# Patient Record
Sex: Female | Born: 1945 | Race: Black or African American | Hispanic: No | Marital: Married | Smoking: Former smoker
Health system: Southern US, Community
[De-identification: ages and names within clinical notes are randomized; demographics above are authoritative.]

## PROBLEM LIST (undated history)

## (undated) DIAGNOSIS — Z973 Presence of spectacles and contact lenses: Secondary | ICD-10-CM

## (undated) DIAGNOSIS — K219 Gastro-esophageal reflux disease without esophagitis: Secondary | ICD-10-CM

## (undated) DIAGNOSIS — I10 Essential (primary) hypertension: Secondary | ICD-10-CM

---

## 1995-02-27 HISTORY — PX: ABDOMINAL HYSTERECTOMY: SHX81

## 1998-12-08 ENCOUNTER — Encounter: Admission: RE | Admit: 1998-12-08 | Discharge: 1998-12-08 | Payer: Self-pay | Admitting: Family Medicine

## 1999-07-26 ENCOUNTER — Encounter: Admission: RE | Admit: 1999-07-26 | Discharge: 1999-07-26 | Payer: Self-pay | Admitting: Obstetrics and Gynecology

## 1999-07-26 ENCOUNTER — Encounter: Payer: Self-pay | Admitting: Obstetrics and Gynecology

## 2002-03-24 ENCOUNTER — Encounter: Admission: RE | Admit: 2002-03-24 | Discharge: 2002-03-24 | Payer: Self-pay | Admitting: Family Medicine

## 2002-03-24 ENCOUNTER — Encounter: Payer: Self-pay | Admitting: Family Medicine

## 2002-03-27 ENCOUNTER — Encounter: Admission: RE | Admit: 2002-03-27 | Discharge: 2002-03-27 | Payer: Self-pay | Admitting: Obstetrics and Gynecology

## 2002-03-27 ENCOUNTER — Encounter: Payer: Self-pay | Admitting: Family Medicine

## 2002-09-28 ENCOUNTER — Encounter: Admission: RE | Admit: 2002-09-28 | Discharge: 2002-09-28 | Payer: Self-pay | Admitting: Obstetrics and Gynecology

## 2002-09-28 ENCOUNTER — Encounter: Payer: Self-pay | Admitting: Obstetrics and Gynecology

## 2004-11-22 ENCOUNTER — Other Ambulatory Visit: Admission: RE | Admit: 2004-11-22 | Discharge: 2004-11-22 | Payer: Self-pay | Admitting: Family Medicine

## 2007-08-15 ENCOUNTER — Encounter: Admission: RE | Admit: 2007-08-15 | Discharge: 2007-08-15 | Payer: Self-pay | Admitting: Family Medicine

## 2008-09-27 ENCOUNTER — Encounter: Admission: RE | Admit: 2008-09-27 | Discharge: 2008-09-27 | Payer: Self-pay | Admitting: Family Medicine

## 2010-09-29 ENCOUNTER — Encounter (INDEPENDENT_AMBULATORY_CARE_PROVIDER_SITE_OTHER): Payer: BC Managed Care – PPO | Admitting: Ophthalmology

## 2010-09-29 DIAGNOSIS — H2510 Age-related nuclear cataract, unspecified eye: Secondary | ICD-10-CM

## 2010-09-29 DIAGNOSIS — H33309 Unspecified retinal break, unspecified eye: Secondary | ICD-10-CM

## 2010-09-29 DIAGNOSIS — H35039 Hypertensive retinopathy, unspecified eye: Secondary | ICD-10-CM

## 2010-09-29 DIAGNOSIS — H43819 Vitreous degeneration, unspecified eye: Secondary | ICD-10-CM

## 2010-10-11 ENCOUNTER — Encounter (INDEPENDENT_AMBULATORY_CARE_PROVIDER_SITE_OTHER): Payer: BC Managed Care – PPO | Admitting: Ophthalmology

## 2010-10-11 DIAGNOSIS — H33309 Unspecified retinal break, unspecified eye: Secondary | ICD-10-CM

## 2011-02-09 ENCOUNTER — Ambulatory Visit (INDEPENDENT_AMBULATORY_CARE_PROVIDER_SITE_OTHER): Payer: BC Managed Care – PPO | Admitting: Ophthalmology

## 2011-02-09 DIAGNOSIS — Q143 Congenital malformation of choroid: Secondary | ICD-10-CM

## 2011-02-09 DIAGNOSIS — H33309 Unspecified retinal break, unspecified eye: Secondary | ICD-10-CM

## 2011-02-09 DIAGNOSIS — E11319 Type 2 diabetes mellitus with unspecified diabetic retinopathy without macular edema: Secondary | ICD-10-CM

## 2011-02-09 DIAGNOSIS — E1139 Type 2 diabetes mellitus with other diabetic ophthalmic complication: Secondary | ICD-10-CM

## 2011-02-09 DIAGNOSIS — H43819 Vitreous degeneration, unspecified eye: Secondary | ICD-10-CM

## 2011-03-08 ENCOUNTER — Other Ambulatory Visit: Payer: Self-pay | Admitting: Orthopedic Surgery

## 2011-03-09 ENCOUNTER — Encounter (HOSPITAL_BASED_OUTPATIENT_CLINIC_OR_DEPARTMENT_OTHER): Payer: Self-pay | Admitting: *Deleted

## 2011-03-09 NOTE — Progress Notes (Signed)
To come in for bmet-ekg  

## 2011-03-12 ENCOUNTER — Other Ambulatory Visit: Payer: Self-pay

## 2011-03-12 ENCOUNTER — Encounter (HOSPITAL_BASED_OUTPATIENT_CLINIC_OR_DEPARTMENT_OTHER)
Admission: RE | Admit: 2011-03-12 | Discharge: 2011-03-12 | Disposition: A | Discharge status: Home / Self Care | Payer: BC Managed Care – PPO | Source: Ambulatory Visit | Attending: Orthopedic Surgery | Admitting: Orthopedic Surgery

## 2011-03-12 LAB — BASIC METABOLIC PANEL
BUN: 15 mg/dL (ref 6–23)
CO2: 30 mEq/L (ref 19–32)
Chloride: 98 mEq/L (ref 96–112)
Creatinine, Ser: 0.72 mg/dL (ref 0.50–1.10)
GFR calc Af Amer: >90 mL/min (ref >90)

## 2011-03-13 ENCOUNTER — Encounter (HOSPITAL_BASED_OUTPATIENT_CLINIC_OR_DEPARTMENT_OTHER)
Admission: RE | Disposition: A | Discharge status: Home / Self Care | Payer: Self-pay | Source: Ambulatory Visit | Attending: Orthopedic Surgery

## 2011-03-13 ENCOUNTER — Encounter (HOSPITAL_BASED_OUTPATIENT_CLINIC_OR_DEPARTMENT_OTHER): Payer: Self-pay | Admitting: Anesthesiology

## 2011-03-13 ENCOUNTER — Encounter (HOSPITAL_BASED_OUTPATIENT_CLINIC_OR_DEPARTMENT_OTHER): Payer: Self-pay | Admitting: Orthopedic Surgery

## 2011-03-13 ENCOUNTER — Ambulatory Visit (HOSPITAL_BASED_OUTPATIENT_CLINIC_OR_DEPARTMENT_OTHER): Payer: BC Managed Care – PPO | Admitting: Anesthesiology

## 2011-03-13 ENCOUNTER — Ambulatory Visit (HOSPITAL_BASED_OUTPATIENT_CLINIC_OR_DEPARTMENT_OTHER)
Admission: RE | Admit: 2011-03-13 | Discharge: 2011-03-13 | Disposition: A | Discharge status: Home / Self Care | Payer: BC Managed Care – PPO | Source: Ambulatory Visit | Attending: Orthopedic Surgery | Admitting: Orthopedic Surgery

## 2011-03-13 DIAGNOSIS — E119 Type 2 diabetes mellitus without complications: Secondary | ICD-10-CM | POA: Insufficient documentation

## 2011-03-13 DIAGNOSIS — K219 Gastro-esophageal reflux disease without esophagitis: Secondary | ICD-10-CM | POA: Insufficient documentation

## 2011-03-13 DIAGNOSIS — Z01810 Encounter for preprocedural cardiovascular examination: Secondary | ICD-10-CM | POA: Insufficient documentation

## 2011-03-13 DIAGNOSIS — M654 Radial styloid tenosynovitis [de Quervain]: Secondary | ICD-10-CM | POA: Insufficient documentation

## 2011-03-13 DIAGNOSIS — I10 Essential (primary) hypertension: Secondary | ICD-10-CM | POA: Insufficient documentation

## 2011-03-13 HISTORY — DX: Essential (primary) hypertension: I10

## 2011-03-13 HISTORY — DX: Gastro-esophageal reflux disease without esophagitis: K21.9

## 2011-03-13 HISTORY — PX: DORSAL COMPARTMENT RELEASE: SHX5039

## 2011-03-13 LAB — GLUCOSE, CAPILLARY: Glucose-Capillary: 148 mg/dL — ABNORMAL HIGH (ref 70–99)

## 2011-03-13 LAB — POCT HEMOGLOBIN-HEMACUE: Hemoglobin: 13.8 g/dL (ref 12.0–15.0)

## 2011-03-13 SURGERY — RELEASE, FIRST DORSAL COMPARTMENT, HAND
Anesthesia: Monitor Anesthesia Care | Site: Wrist | Laterality: Right | Wound class: Clean

## 2011-03-13 MED ORDER — METOCLOPRAMIDE HCL 5 MG/ML IJ SOLN: 10.0000 mg | Freq: Once | INTRAMUSCULAR | Status: DC | PRN

## 2011-03-13 MED ORDER — HYDROCODONE-ACETAMINOPHEN 5-325 MG PO TABS: ORAL_TABLET | ORAL | Status: AC

## 2011-03-13 MED ORDER — CHLORHEXIDINE GLUCONATE 4 % EX LIQD: 60.0000 mL | Freq: Once | CUTANEOUS | Status: DC

## 2011-03-13 MED ORDER — MORPHINE SULFATE 2 MG/ML IJ SOLN: 0.0500 mg/kg | INTRAMUSCULAR | Status: DC | PRN

## 2011-03-13 MED ORDER — LIDOCAINE HCL (CARDIAC) 20 MG/ML IV SOLN
INTRAVENOUS | Status: DC | PRN
  Administered 2011-03-13: 40 mg via INTRAVENOUS

## 2011-03-13 MED ORDER — ONDANSETRON HCL 4 MG/2ML IJ SOLN
INTRAMUSCULAR | Status: DC | PRN
  Administered 2011-03-13: 4 mg via INTRAVENOUS

## 2011-03-13 MED ORDER — LACTATED RINGERS IV SOLN
INTRAVENOUS | Status: DC
  Administered 2011-03-13 (×2): via INTRAVENOUS

## 2011-03-13 MED ORDER — FENTANYL CITRATE 0.05 MG/ML IJ SOLN
INTRAMUSCULAR | Status: DC | PRN
  Administered 2011-03-13: 50 ug via INTRAVENOUS

## 2011-03-13 MED ORDER — LIDOCAINE HCL 2 % IJ SOLN
INTRAMUSCULAR | Status: DC | PRN
  Administered 2011-03-13: 3 mL

## 2011-03-13 MED ORDER — MIDAZOLAM HCL 5 MG/5ML IJ SOLN
INTRAMUSCULAR | Status: DC | PRN
  Administered 2011-03-13: 1 mg via INTRAVENOUS

## 2011-03-13 MED ORDER — FENTANYL CITRATE 0.05 MG/ML IJ SOLN
25.0000 ug | INTRAMUSCULAR | Status: DC | PRN
  Administered 2011-03-13 (×2): 25 ug via INTRAVENOUS

## 2011-03-13 MED ORDER — PROPOFOL 10 MG/ML IV EMUL
INTRAVENOUS | Status: DC | PRN
  Administered 2011-03-13: 200 ug/kg/min via INTRAVENOUS

## 2011-03-13 SURGICAL SUPPLY — 41 items
BANDAGE ELASTIC 3 VELCRO ST LF (GAUZE/BANDAGES/DRESSINGS) ×2 IMPLANT
BLADE MINI RND TIP GREEN BEAV (BLADE) IMPLANT
BLADE SURG 15 STRL LF DISP TIS (BLADE) ×1 IMPLANT
BLADE SURG 15 STRL SS (BLADE) ×2
BNDG CMPR 9X4 STRL LF SNTH (GAUZE/BANDAGES/DRESSINGS) ×1
BNDG ESMARK 4X9 LF (GAUZE/BANDAGES/DRESSINGS) ×1 IMPLANT
BRUSH SCRUB EZ PLAIN DRY (MISCELLANEOUS) ×2 IMPLANT
CLOTH BEACON ORANGE TIMEOUT ST (SAFETY) ×2 IMPLANT
CORDS BIPOLAR (ELECTRODE) IMPLANT
COVER MAYO STAND STRL (DRAPES) ×2 IMPLANT
COVER TABLE BACK 60X90 (DRAPES) ×2 IMPLANT
CUFF TOURNIQUET SINGLE 18IN (TOURNIQUET CUFF) IMPLANT
DECANTER SPIKE VIAL GLASS SM (MISCELLANEOUS) IMPLANT
DRAPE EXTREMITY T 121X128X90 (DRAPE) ×2 IMPLANT
DRAPE SURG 17X23 STRL (DRAPES) ×2 IMPLANT
DRSG TEGADERM 4X4.75 (GAUZE/BANDAGES/DRESSINGS) ×1 IMPLANT
GLOVE BIO SURGEON STRL SZ 6.5 (GLOVE) ×1 IMPLANT
GLOVE BIOGEL M STRL SZ7.5 (GLOVE) ×2 IMPLANT
GLOVE EXAM NITRILE MD LF STRL (GLOVE) ×1 IMPLANT
GLOVE ORTHO TXT STRL SZ7.5 (GLOVE) ×2 IMPLANT
GOWN PREVENTION PLUS XLARGE (GOWN DISPOSABLE) ×2 IMPLANT
GOWN PREVENTION PLUS XXLARGE (GOWN DISPOSABLE) ×2 IMPLANT
GOWN STRL REIN XL XLG (GOWN DISPOSABLE) ×2 IMPLANT
NEEDLE 27GAX1X1/2 (NEEDLE) ×1 IMPLANT
PACK BASIN DAY SURGERY FS (CUSTOM PROCEDURE TRAY) ×2 IMPLANT
PAD CAST 3X4 CTTN HI CHSV (CAST SUPPLIES) IMPLANT
PADDING CAST ABS 4INX4YD NS (CAST SUPPLIES)
PADDING CAST ABS COTTON 4X4 ST (CAST SUPPLIES) ×1 IMPLANT
PADDING CAST COTTON 3X4 STRL (CAST SUPPLIES)
SLEEVE SCD COMPRESS KNEE MED (MISCELLANEOUS) IMPLANT
SPONGE GAUZE 4X4 12PLY (GAUZE/BANDAGES/DRESSINGS) ×1 IMPLANT
STOCKINETTE 4X48 STRL (DRAPES) ×2 IMPLANT
STRIP CLOSURE SKIN 1/2X4 (GAUZE/BANDAGES/DRESSINGS) ×2 IMPLANT
SUT PROLENE 3 0 PS 2 (SUTURE) ×2 IMPLANT
SUT VIC AB 4-0 P-3 18XBRD (SUTURE) IMPLANT
SUT VIC AB 4-0 P3 18 (SUTURE)
SYR 3ML 23GX1 SAFETY (SYRINGE) IMPLANT
SYR CONTROL 10ML LL (SYRINGE) ×1 IMPLANT
TRAY DSU PREP LF (CUSTOM PROCEDURE TRAY) ×2 IMPLANT
UNDERPAD 30X30 INCONTINENT (UNDERPADS AND DIAPERS) ×2 IMPLANT
WATER STERILE IRR 1000ML POUR (IV SOLUTION) ×1 IMPLANT

## 2011-03-13 NOTE — H&P (View-Only) (Signed)
To come in for bmet-ekg  

## 2011-03-13 NOTE — Op Note (Signed)
Op note dictated 03/13/11 [id number]

## 2011-03-13 NOTE — Anesthesia Preprocedure Evaluation (Signed)
Anesthesia Evaluation  Patient identified by MRN, date of birth, ID band Patient awake    Reviewed: Allergy & Precautions, H&P , NPO status , Patient's Chart, lab work & pertinent test results, reviewed documented beta blocker date and time   Airway Mallampati: II TM Distance: >3 FB Neck ROM: full    Dental   Pulmonary neg pulmonary ROS,          Cardiovascular hypertension,     Neuro/Psych Negative Neurological ROS  Negative Psych ROS   GI/Hepatic negative GI ROS, Neg liver ROS,   Endo/Other  Diabetes mellitus-, Type 2  Renal/GU negative Renal ROS  Genitourinary negative   Musculoskeletal   Abdominal   Peds  Hematology negative hematology ROS (+)   Anesthesia Other Findings See surgeon's H&P   Reproductive/Obstetrics negative OB ROS                           Anesthesia Physical Anesthesia Plan  ASA: III  Anesthesia Plan: MAC   Post-op Pain Management:    Induction: Intravenous  Airway Management Planned: Simple Face Mask  Additional Equipment:   Intra-op Plan:   Post-operative Plan:   Informed Consent: I have reviewed the patients History and Physical, chart, labs and discussed the procedure including the risks, benefits and alternatives for the proposed anesthesia with the patient or authorized representative who has indicated his/her understanding and acceptance.     Plan Discussed with: CRNA and Surgeon  Anesthesia Plan Comments:         Anesthesia Quick Evaluation

## 2011-03-13 NOTE — H&P (Signed)
Penny Jackson is an 66 y.o. female.   Chief Complaint: Complaining of chronic right wrist pain HPI:. 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 She accompanies voice students on the piano 3 hours daily. 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 She has been under the care of Dr. Renae Fickle for bilateral deQuervain's stenosing tenosynovitis. She has been using wrist forearm splints and a CMC buttress splint on the left. She has had 2 steroid injections into the region of her left first dorsal compartment and one on the right. Her left is improved, however she has significant steroid atrophy. On the right she has very significant pain at the first dorsal compartment.   Past Medical History  Diagnosis Date  . Hypertension   . Diabetes mellitus   . GERD (gastroesophageal reflux disease)     Past Surgical History  Procedure Date  . Abdominal hysterectomy 1997    History reviewed. No pertinent family history. Social History:  reports that she quit smoking about 26 years ago. She does not have any smokeless tobacco history on file. She reports that she does not drink alcohol or use illicit drugs.  Allergies: No Known Allergies  Medications Prior to Admission  Medication Dose Route Frequency Provider Last Rate Last Dose  . chlorhexidine (HIBICLENS) 4 % liquid 4 application  60 mL Topical Once       . lactated ringers infusion   Intravenous Continuous Constance Goltz, MD       Medications Prior to Admission  Medication Sig Dispense Refill  . glipiZIDE (GLUCOTROL XL) 5 MG 24 hr tablet Take 5 mg by mouth daily.      . metFORMIN (GLUMETZA) 500 MG (MOD) 24 hr tablet Take 500 mg by mouth daily with breakfast.      . valsartan-hydrochlorothiazide (DIOVAN-HCT) 320-25 MG per tablet Take 1 tablet by mouth daily.        Results for orders placed during the hospital encounter of 03/13/11 (from the past 48 hour(s))  BASIC METABOLIC PANEL     Status:  Abnormal   Collection Time   03/12/11  1:00 PM      Component Value Range Comment   Sodium 136  135 - 145 (mEq/L)    Potassium 4.3  3.5 - 5.1 (mEq/L)    Chloride 98  96 - 112 (mEq/L)    CO2 30  19 - 32 (mEq/L)    Glucose, Bld 167 (*) 70 - 99 (mg/dL)    BUN 15  6 - 23 (mg/dL)    Creatinine, Ser 1.61  0.50 - 1.10 (mg/dL)    Calcium 9.6  8.4 - 10.5 (mg/dL)    GFR calc non Af Amer 87 (*) >90 (mL/min)    GFR calc Af Amer >90  >90 (mL/min)   GLUCOSE, CAPILLARY     Status: Abnormal   Collection Time   03/13/11  8:58 AM      Component Value Range Comment   Glucose-Capillary 184 (*) 70 - 99 (mg/dL)   POCT HEMOGLOBIN-HEMACUE     Status: Normal   Collection Time   03/13/11  9:00 AM      Component Value Range Comment   Hemoglobin 13.8  12.0 - 15.0 (g/dL)     No results found.   Pertinent items are noted in HPI.  Blood pressure 160/90, pulse 63, temperature 98.5 F (36.9 C), temperature source Oral, resp. rate 20, height 5\' 7"  (1.702 m), weight 86.183 kg (  190 lb), SpO2 98.00%.  General appearance: alert Head: Normocephalic, without obvious abnormality Neck: supple, symmetrical, trachea midline Resp: clear to auscultation bilaterally Cardio: regular rate and rhythm, S1, S2 normal, no murmur, click, rub or gallop GI: normal findings: bowel sounds normal Extremities: no edema, redness or tenderness in the calves or thighs Pulses: 2+ and symmetric Skin: normal Neurologic: Grossly normal    Assessment/Plan Impression: Significant de Quervain's stenosing tenosynovitis right first dorsal compartment  Plan: Patient to be taken to the operating room to undergo release of her right first dorsal compartment under local anesthesia and sedation. The surgery after care risks benefits were described in detail. The patient was in agreement with this plan.  DASNOIT,Joby Richart J 03/13/2011, 9:18 AM    H&P documentation: 03/13/2011  -History and Physical Reviewed  -Patient has been  re-examined  -No change in the plan of care  Wyn Forster, MD

## 2011-03-13 NOTE — Anesthesia Postprocedure Evaluation (Signed)
Anesthesia Post Note  Patient: Penny Jackson  Procedure(s) Performed:  RELEASE DORSAL COMPARTMENT (DEQUERVAIN) - release 1st dorsal compartment on right  Anesthesia type: General  Patient location: PACU  Post pain: Pain level controlled  Post assessment: Patient's Cardiovascular Status Stable  Last Vitals:  Filed Vitals:   03/13/11 1100  BP: 156/75  Pulse: 56  Temp:   Resp: 10    Post vital signs: Reviewed and stable  Level of consciousness: alert  Complications: No apparent anesthesia complications

## 2011-03-13 NOTE — Brief Op Note (Signed)
03/13/2011  10:14 AM  PATIENT:  Penny Jackson  66 y.o. female  PRE-OPERATIVE DIAGNOSIS:  dequervains syndrome right wrist  POST-OPERATIVE DIAGNOSIS:  dequervains syndrome right wrist  PROCEDURE:  Procedure(s): RELEASE DORSAL COMPARTMENT (DEQUERVAIN)  SURGEON:  Surgeon(s): Wyn Forster., MD  PHYSICIAN ASSISTANT:   ASSISTANTS: Mallory Shirk.A-C   ANESTHESIA:   IV sedation  EBL:  Total I/O In: 400 [I.V.:400] Out: -   BLOOD ADMINISTERED:none  DRAINS: none   LOCAL MEDICATIONS USED:  XYLOCAINE 3 CC 2%  SPECIMEN:  No Specimen  DISPOSITION OF SPECIMEN:  N/A  COUNTS:  YES  TOURNIQUET:   Total Tourniquet Time Documented: Upper Arm (Right) - 9 minutes  DICTATION: .Other Dictation: Dictation Number [id number]  PLAN OF CARE: Discharge to home after PACU  PATIENT DISPOSITION:  PACU - hemodynamically stable.   Delay start of Pharmacological VTE agent (>24hrs) due to surgical blood loss or risk of bleeding:  NO/NOT APPLICABLE

## 2011-03-13 NOTE — Transfer of Care (Signed)
Immediate Anesthesia Transfer of Care Note  Patient: Penny Jackson  Procedure(s) Performed:  RELEASE DORSAL COMPARTMENT (DEQUERVAIN) - release 1st dorsal compartment on right  Patient Location: PACU  Anesthesia Type: MAC  Level of Consciousness: awake, alert  and oriented  Airway & Oxygen Therapy: Patient Spontanous Breathing and Patient connected to face mask oxygen  Post-op Assessment: Report given to PACU RN and Post -op Vital signs reviewed and stable  Post vital signs: Reviewed and stable Filed Vitals:   03/13/11 0823  BP: 160/90  Pulse: 63  Temp: 36.9 C  Resp: 20    Complications: No apparent anesthesia complications

## 2011-03-13 NOTE — Interval H&P Note (Signed)
History and Physical Interval Note:  03/13/2011 9:32 AM  Penny Jackson  has presented today for surgery, with the diagnosis of dequervains on right  The various methods of treatment have been discussed with the patient and family. After consideration of risks, benefits and other options for treatment, the patient has consented to  Procedure(s): RELEASE DORSAL COMPARTMENT (DEQUERVAIN) as a surgical intervention .  The patients' history has been reviewed, patient examined, no change in status, stable for surgery.  I have reviewed the patients' chart and labs.  Questions were answered to the patient's satisfaction.     Penny Jackson,Penny Jackson  H&P documentation: 03/13/2011  -History and Physical Reviewed  -Patient has been re-examined  -No change in the plan of care  Penny Forster, MD

## 2011-03-13 NOTE — Anesthesia Procedure Notes (Signed)
Procedure Name: MAC Performed by: Yeilyn Gent Pre-anesthesia Checklist: Patient identified, Timeout performed, Emergency Drugs available, Suction available and Patient being monitored Patient Re-evaluated:Patient Re-evaluated prior to inductionOxygen Delivery Method: Simple face mask     

## 2011-03-14 ENCOUNTER — Encounter (HOSPITAL_BASED_OUTPATIENT_CLINIC_OR_DEPARTMENT_OTHER): Payer: Self-pay | Admitting: Orthopedic Surgery

## 2011-03-14 NOTE — Op Note (Signed)
NAME:  TERRIN, IMPARATO NO.:  MEDICAL RECORD NO.:  [id number]  LOCATION:                                 FACILITY:  PHYSICIAN:  Katy Fitch. Keaun Schnabel, M.D.      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  POSTOPERATIVE DIAGNOSIS:  Profound first dorsal compartment stenosing tenosynovitis with 4.5 mm calcified wall thickness with septum separating extensor pollicis brevis.  OPERATION:  Release of right first dorsal compartment.  OPERATING SURGEON:  Katy Fitch. Jaclyn Andy, MD  ASSISTANT:  Marveen Reeks Dasnoit, PA-C  ANESTHESIA:  Lidocaine 2% field block supplemented by IV sedation.  SUPERVISING ANESTHESIOLOGIST:  Janetta Hora. 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 She has been under the care of Dr. Jearld Adjutant with injection and splinting.  She has had partial resolution on the left.  She had profound symptoms on the right.  She was subsequently referred by her primary care physician, Dr. Duane Lope for an independent medical evaluation.  Clinical examination revealed end-stage stenosing tenosynovitis of the right first dorsal compartment.  We recommended proceeding directly to release as with wall thickness of more than 3-4 mm it is virtually impossible to resolve this in a nonoperative manner in our experience.  After detailed informed consent in our office, we recommended proceeding with release of the right first compartment at this time.  Risk factors for this condition were noted to be diabetes and repetitive use of her hands in her profession.  Questions were invited and answered in  detail.  Preoperatively, she was interviewed by Dr. Gelene Mink who recommended local anesthesia with sedation.  After informed consent, she is brought to the operating room for sedation under Dr. Thornton Dales direct supervision.  PROCEDURE:  Zeola Tobin-Elling was brought to room 2 of the [facility] and placed in supine position upon the operating table.  Following IV sedation and informed consent, the right wrist was prepped with Betadine and the first dorsal compartment and skin anesthetized with 3 mL of 2% lidocaine.  After 10 minutes, excellent anesthesia was achieved.  The right hand and arm were then prepped with Betadine soap and solution and sterilely draped.  A pneumatic tourniquet was applied to the proximal right brachium.  Following exsanguination of the right arm with an Esmarch bandage, arterial tourniquet was inflated to 220 mmHg.  Procedure commenced with a routine surgical time-out.  A short transverse incision was fashioned directly over the first dorsal compartment.  Subcutaneous tissues were carefully divided taking care to identify the first dorsal compartment.  There was noted to be an opaque thickening of the compartment.  The radial superficial sensory branches were adherent to the compartment and were gently dissected away from the compartment with a blunt Freer elevator followed by placement of blunt Ragnell retractors.  With the compartment fully exposed, we released the compartment  at its apex with scalpel and scissors.  The wall thickness was measured to be 4.5 mm with a ruler.  There was noted to be a separate compartment at the extensor pollicis brevis.  There were 2 slips of the abductor pollicis longus.  After the septum was resected, the wound was inspected for bleeding points.  We subsequently repaired the wound with intradermal 3-0 Prolene and Steri- Strips.  Ms. Fifer was placed in a compressive dressing with a Steri- Strips,  sterile gauze, a Tegaderm, followed by Ace wrap.  We will encourage immediate range of motion exercises to her wrist and fingers.  For aftercare, she is provided a prescription for hydrocodone 5 mg 1 p.o. q.4-6 h. p.r.n. pain 20 tablets without refill.     Katy Fitch Pat Sires, M.D.     RVS/MEDQ  D:  03/13/2011  T:  03/14/2011  Job:  [id number]  cc:   Magnus Sinning) Tenny Craw, M.D.

## 2011-07-28 ENCOUNTER — Ambulatory Visit (INDEPENDENT_AMBULATORY_CARE_PROVIDER_SITE_OTHER): Payer: BC Managed Care – PPO | Admitting: Family Medicine

## 2011-07-28 VITALS — BP 138/72 | HR 68 | Temp 98.1°F | Resp 16 | Ht 67.0 in | Wt 194.0 lb

## 2011-07-28 DIAGNOSIS — R05 Cough: Secondary | ICD-10-CM

## 2011-07-28 DIAGNOSIS — J029 Acute pharyngitis, unspecified: Secondary | ICD-10-CM

## 2011-07-28 DIAGNOSIS — R059 Cough, unspecified: Secondary | ICD-10-CM

## 2011-07-28 LAB — POCT RAPID STREP A (OFFICE): Rapid Strep A Screen: NEGATIVE

## 2011-07-28 MED ORDER — AZITHROMYCIN 250 MG PO TABS: ORAL_TABLET | ORAL | Status: AC

## 2011-07-28 MED ORDER — HYDROCODONE-HOMATROPINE 5-1.5 MG/5ML PO SYRP: 5.0000 mL | ORAL_SOLUTION | Freq: Three times a day (TID) | ORAL | Status: AC | PRN

## 2011-07-28 NOTE — Progress Notes (Signed)
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 She comes in today with a five-day history of cough and progressive sore throat. She's had no fever, hemoptysis, or shortness of breath.  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

## 2011-07-28 NOTE — Patient Instructions (Signed)

## 2012-03-06 ENCOUNTER — Other Ambulatory Visit: Payer: Self-pay | Admitting: Orthopedic Surgery

## 2012-03-06 ENCOUNTER — Ambulatory Visit
Admission: RE | Admit: 2012-03-06 | Discharge: 2012-03-06 | Disposition: A | Discharge status: Home / Self Care | Payer: BC Managed Care – PPO | Source: Ambulatory Visit | Attending: Orthopedic Surgery | Admitting: Orthopedic Surgery

## 2012-03-06 DIAGNOSIS — L84 Corns and callosities: Secondary | ICD-10-CM

## 2012-08-26 ENCOUNTER — Other Ambulatory Visit: Payer: Self-pay | Admitting: Orthopedic Surgery

## 2012-09-04 ENCOUNTER — Encounter (HOSPITAL_BASED_OUTPATIENT_CLINIC_OR_DEPARTMENT_OTHER): Payer: Self-pay | Admitting: *Deleted

## 2012-09-04 NOTE — Progress Notes (Signed)
To come in for bmet-ekg  

## 2012-09-08 ENCOUNTER — Encounter (HOSPITAL_BASED_OUTPATIENT_CLINIC_OR_DEPARTMENT_OTHER)
Admission: RE | Admit: 2012-09-08 | Discharge: 2012-09-08 | Disposition: A | Discharge status: Home / Self Care | Payer: BC Managed Care – PPO | Source: Ambulatory Visit | Attending: Orthopedic Surgery | Admitting: Orthopedic Surgery

## 2012-09-08 LAB — BASIC METABOLIC PANEL
Chloride: 99 mEq/L (ref 96–112)
GFR calc Af Amer: >90 mL/min (ref >90)
Potassium: 4.4 mEq/L (ref 3.5–5.1)
Sodium: 138 mEq/L (ref 135–145)

## 2012-09-08 NOTE — Progress Notes (Signed)
EKG reviewed by Dr Chaney Malling. Compared to previous EKG 03/12/11. No new orders

## 2012-09-08 NOTE — H&P (Signed)
  Penny Jackson is an 67 y.o. female.   Chief Complaint: c/o chronic and progressive STS symptoms of the left thumb HPI: . She has a locked left trigger thumb and swelling over the left first dorsal compartment.No history of injury to the thumb. She had an excellent long term result following release of her right first dorsal compartment on 03-13-11.    Past Medical History  Diagnosis Date  . Hypertension   . Diabetes mellitus   . GERD (gastroesophageal reflux disease)   . Wears glasses     Past Surgical History  Procedure Laterality Date  . Abdominal hysterectomy  1997  . Dorsal compartment release  03/13/2011    Procedure: RELEASE DORSAL COMPARTMENT (DEQUERVAIN);  Surgeon: Wyn Forster., MD;  Location: [facility];  Service: Orthopedics;  Laterality: Right;  release 1st dorsal compartment on right    Family History  Problem Relation Age of Onset  . Heart disease Mother   . Diabetes Mother   . Heart disease Father    Social History:  reports that she quit smoking about 27 years ago. She does not have any smokeless tobacco history on file. She reports that she does not drink alcohol or use illicit drugs.  Allergies: No Known Allergies  No prescriptions prior to admission    No results found for this or any previous visit (from the past 48 hour(s)).  No results found.   Pertinent items are noted in HPI.  Height 5\' 7"  (1.702 m), weight 87.998 kg (194 lb).  General appearance: alert Head: Normocephalic, without obvious abnormality Neck: supple, symmetrical, trachea midline Resp: clear to auscultation bilaterally Cardio: regular rate and rhythm GI: normal findings: bowel sounds normal Extremities: Inspection of her hands reveals mild swelling over her left first dorsal compartment. She is slightly tender on palpation. She does not have a painful Finkelstein's maneuver. She has a locked trigger thumb with tenderness over the A-1 pulley. She has  osteophytes at the thumb IP joint on the left and pain with attempted extension. She has full finger flexion/extension on the left. Her pulses and capillary refill are intact bilaterally.  X-rays of her thumb were obtained. 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 Her MP joint is well preserved. 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  DASNOIT,Kyuss Hale J 09/08/2012, 2:24 PM  H&P documentation: 09/09/2012  -History and Physical Reviewed  -Patient has been re-examined  -No change in the plan of care  Wyn Forster, MD

## 2012-09-09 ENCOUNTER — Ambulatory Visit (HOSPITAL_BASED_OUTPATIENT_CLINIC_OR_DEPARTMENT_OTHER)
Admission: RE | Admit: 2012-09-09 | Discharge: 2012-09-09 | Disposition: A | Discharge status: Home / Self Care | Payer: BC Managed Care – PPO | Source: Ambulatory Visit | Attending: Orthopedic Surgery | Admitting: Orthopedic Surgery

## 2012-09-09 ENCOUNTER — Encounter (HOSPITAL_BASED_OUTPATIENT_CLINIC_OR_DEPARTMENT_OTHER): Payer: Self-pay | Admitting: *Deleted

## 2012-09-09 ENCOUNTER — Ambulatory Visit (HOSPITAL_BASED_OUTPATIENT_CLINIC_OR_DEPARTMENT_OTHER): Payer: BC Managed Care – PPO | Admitting: *Deleted

## 2012-09-09 ENCOUNTER — Encounter (HOSPITAL_BASED_OUTPATIENT_CLINIC_OR_DEPARTMENT_OTHER)
Admission: RE | Disposition: A | Discharge status: Home / Self Care | Payer: Self-pay | Source: Ambulatory Visit | Attending: Orthopedic Surgery

## 2012-09-09 DIAGNOSIS — E119 Type 2 diabetes mellitus without complications: Secondary | ICD-10-CM | POA: Insufficient documentation

## 2012-09-09 DIAGNOSIS — M65839 Other synovitis and tenosynovitis, unspecified forearm: Secondary | ICD-10-CM | POA: Insufficient documentation

## 2012-09-09 DIAGNOSIS — K219 Gastro-esophageal reflux disease without esophagitis: Secondary | ICD-10-CM | POA: Insufficient documentation

## 2012-09-09 DIAGNOSIS — M6530 Trigger finger, unspecified finger: Secondary | ICD-10-CM | POA: Insufficient documentation

## 2012-09-09 DIAGNOSIS — I10 Essential (primary) hypertension: Secondary | ICD-10-CM | POA: Insufficient documentation

## 2012-09-09 HISTORY — DX: Presence of spectacles and contact lenses: Z97.3

## 2012-09-09 HISTORY — PX: TRIGGER FINGER RELEASE: SHX641

## 2012-09-09 LAB — GLUCOSE, CAPILLARY: Glucose-Capillary: 228 mg/dL — ABNORMAL HIGH (ref 70–99)

## 2012-09-09 SURGERY — RELEASE, A1 PULLEY, FOR TRIGGER FINGER
Anesthesia: General | Site: Thumb | Laterality: Left | Wound class: Clean

## 2012-09-09 MED ORDER — HYDROMORPHONE HCL PF 1 MG/ML IJ SOLN
0.2500 mg | INTRAMUSCULAR | Status: DC | PRN
  Administered 2012-09-09 (×2): 0.5 mg via INTRAVENOUS

## 2012-09-09 MED ORDER — ONDANSETRON HCL 4 MG/2ML IJ SOLN
INTRAMUSCULAR | Status: DC | PRN
  Administered 2012-09-09: 4 mg via INTRAVENOUS

## 2012-09-09 MED ORDER — OXYCODONE HCL 5 MG/5ML PO SOLN: 5.0000 mg | Freq: Once | ORAL | Status: DC | PRN

## 2012-09-09 MED ORDER — OXYCODONE-ACETAMINOPHEN 5-325 MG PO TABS: ORAL_TABLET | ORAL | Status: DC

## 2012-09-09 MED ORDER — LIDOCAINE HCL 2 % IJ SOLN
INTRAMUSCULAR | Status: DC | PRN
  Administered 2012-09-09: 4 mL

## 2012-09-09 MED ORDER — CHLORHEXIDINE GLUCONATE 4 % EX LIQD: 60.0000 mL | Freq: Once | CUTANEOUS | Status: DC

## 2012-09-09 MED ORDER — PROMETHAZINE HCL 25 MG/ML IJ SOLN: 6.2500 mg | INTRAMUSCULAR | Status: DC | PRN

## 2012-09-09 MED ORDER — MIDAZOLAM HCL 5 MG/5ML IJ SOLN
INTRAMUSCULAR | Status: DC | PRN
  Administered 2012-09-09: 2 mg via INTRAVENOUS

## 2012-09-09 MED ORDER — FENTANYL CITRATE 0.05 MG/ML IJ SOLN
INTRAMUSCULAR | Status: DC | PRN
  Administered 2012-09-09: 100 ug via INTRAVENOUS

## 2012-09-09 MED ORDER — OXYCODONE HCL 5 MG PO TABS: 5.0000 mg | ORAL_TABLET | Freq: Once | ORAL | Status: DC | PRN

## 2012-09-09 MED ORDER — LACTATED RINGERS IV SOLN
INTRAVENOUS | Status: DC
  Administered 2012-09-09: 08:00:00 via INTRAVENOUS

## 2012-09-09 MED ORDER — PROPOFOL 10 MG/ML IV BOLUS
INTRAVENOUS | Status: DC | PRN
  Administered 2012-09-09: 50 mg via INTRAVENOUS
  Administered 2012-09-09: 150 mg via INTRAVENOUS

## 2012-09-09 MED ORDER — DEXAMETHASONE SODIUM PHOSPHATE 10 MG/ML IJ SOLN
INTRAMUSCULAR | Status: DC | PRN
  Administered 2012-09-09: 4 mg via INTRAVENOUS

## 2012-09-09 SURGICAL SUPPLY — 38 items
BANDAGE ADHESIVE 1X3 (GAUZE/BANDAGES/DRESSINGS) IMPLANT
BANDAGE COBAN STERILE 2 (GAUZE/BANDAGES/DRESSINGS) ×1 IMPLANT
BANDAGE ELASTIC 3 VELCRO ST LF (GAUZE/BANDAGES/DRESSINGS) ×2 IMPLANT
BLADE SURG 15 STRL LF DISP TIS (BLADE) ×1 IMPLANT
BLADE SURG 15 STRL SS (BLADE) ×2
BNDG CMPR 9X4 STRL LF SNTH (GAUZE/BANDAGES/DRESSINGS) ×1
BNDG ESMARK 4X9 LF (GAUZE/BANDAGES/DRESSINGS) ×1 IMPLANT
BRUSH SCRUB EZ PLAIN DRY (MISCELLANEOUS) ×2 IMPLANT
CLOTH BEACON ORANGE TIMEOUT ST (SAFETY) ×2 IMPLANT
CORDS BIPOLAR (ELECTRODE) ×1 IMPLANT
COVER MAYO STAND STRL (DRAPES) ×2 IMPLANT
COVER TABLE BACK 60X90 (DRAPES) ×2 IMPLANT
CUFF TOURNIQUET SINGLE 18IN (TOURNIQUET CUFF) ×1 IMPLANT
DECANTER SPIKE VIAL GLASS SM (MISCELLANEOUS) IMPLANT
DRAPE EXTREMITY T 121X128X90 (DRAPE) ×2 IMPLANT
DRAPE SURG 17X23 STRL (DRAPES) ×2 IMPLANT
GLOVE BIO SURGEON STRL SZ 6.5 (GLOVE) ×1 IMPLANT
GLOVE BIOGEL M STRL SZ7.5 (GLOVE) ×1 IMPLANT
GLOVE BIOGEL PI IND STRL 7.0 (GLOVE) IMPLANT
GLOVE BIOGEL PI INDICATOR 7.0 (GLOVE) ×1
GLOVE ORTHO TXT STRL SZ7.5 (GLOVE) ×2 IMPLANT
GOWN BRE IMP PREV XXLGXLNG (GOWN DISPOSABLE) ×4 IMPLANT
GOWN PREVENTION PLUS XLARGE (GOWN DISPOSABLE) ×1 IMPLANT
NEEDLE 27GAX1X1/2 (NEEDLE) ×1 IMPLANT
PACK BASIN DAY SURGERY FS (CUSTOM PROCEDURE TRAY) ×2 IMPLANT
PADDING CAST ABS 4INX4YD NS (CAST SUPPLIES) ×1
PADDING CAST ABS COTTON 4X4 ST (CAST SUPPLIES) ×1 IMPLANT
SPONGE GAUZE 4X4 12PLY (GAUZE/BANDAGES/DRESSINGS) ×2 IMPLANT
STOCKINETTE 4X48 STRL (DRAPES) ×2 IMPLANT
STRIP CLOSURE SKIN 1/2X4 (GAUZE/BANDAGES/DRESSINGS) IMPLANT
SUT ETHILON 5 0 P 3 18 (SUTURE)
SUT NYLON ETHILON 5-0 P-3 1X18 (SUTURE) IMPLANT
SUT PROLENE 3 0 PS 2 (SUTURE) IMPLANT
SUT PROLENE 4 0 P 3 18 (SUTURE) ×1 IMPLANT
SYR 3ML 23GX1 SAFETY (SYRINGE) IMPLANT
SYR CONTROL 10ML LL (SYRINGE) ×1 IMPLANT
TRAY DSU PREP LF (CUSTOM PROCEDURE TRAY) ×2 IMPLANT
UNDERPAD 30X30 INCONTINENT (UNDERPADS AND DIAPERS) ×2 IMPLANT

## 2012-09-09 NOTE — Anesthesia Procedure Notes (Signed)
Procedure Name: LMA Insertion Date/Time: 09/09/2012 8:29 AM Performed by: Meyer Russel Pre-anesthesia Checklist: Patient identified, Emergency Drugs available, Suction available and Patient being monitored Patient Re-evaluated:Patient Re-evaluated prior to inductionOxygen Delivery Method: Circle System Utilized Preoxygenation: Pre-oxygenation with 100% oxygen Intubation Type: IV induction Ventilation: Mask ventilation without difficulty LMA: LMA inserted LMA Size: 4.0 Number of attempts: 1 Airway Equipment and Method: bite block Placement Confirmation: positive ETCO2 and breath sounds checked- equal and bilateral Tube secured with: Tape Dental Injury: Teeth and Oropharynx as per pre-operative assessment

## 2012-09-09 NOTE — Op Note (Signed)
[id number] 

## 2012-09-09 NOTE — Brief Op Note (Signed)
09/09/2012  9:08 AM  PATIENT:  Penny Jackson  67 y.o. female  PRE-OPERATIVE DIAGNOSIS:  LOCKED TRIGGER THUMB LEFT LEFT FIRST DORSAL COMPARTMENT STENOSING TENOSYNOVITIS  POST-OPERATIVE DIAGNOSIS:  LOCKED TRIGGER THUMB LEFT FIRST DORSAL COMPARTMENT STENOSING TENOSYNOVITIS LEFT  PROCEDURE:  Procedure(s): RELEASE TRIGGER FINGER/A-1 PULLEY LEFT THUMB FIRST DORSAL COMPARTMENT RELEASE LEFT (Left)  SURGEON:  Surgeon(s) and Role:    * Wyn Forster., MD - Primary  PHYSICIAN ASSISTANT:   ASSISTANTS: Registered nurse  ANESTHESIA:   general  EBL:  Total I/O In: 100 [I.V.:100] Out: -   BLOOD ADMINISTERED:none  DRAINS: none   LOCAL MEDICATIONS USED:  XYLOCAINE   SPECIMEN:  No Specimen  DISPOSITION OF SPECIMEN:  N/A  COUNTS:  YES  TOURNIQUET:   Total Tourniquet Time Documented: Upper Arm (Left) - 25 minutes Total: Upper Arm (Left) - 25 minutes   DICTATION: .Other Dictation: Dictation Number [id number]  PLAN OF CARE: Discharge to home after PACU  PATIENT DISPOSITION:  PACU - hemodynamically stable.   Delay start of Pharmacological VTE agent (>24hrs) due to surgical blood loss or risk of bleeding: not applicable

## 2012-09-09 NOTE — Anesthesia Postprocedure Evaluation (Signed)
Anesthesia Post Note  Patient: Penny Jackson  Procedure(s) Performed: Procedure(s) (LRB): RELEASE TRIGGER FINGER/A-1 PULLEY LEFT THUMB FIRST DORSAL COMPARTMENT RELEASE LEFT (Left)  Anesthesia type: general  Patient location: PACU  Post pain: Pain level controlled  Post assessment: Patient's Cardiovascular Status Stable  Last Vitals:  Filed Vitals:   09/09/12 1000  BP: 138/81  Pulse: 63  Temp:   Resp: 10    Post vital signs: Reviewed and stable  Level of consciousness: sedated  Complications: No apparent anesthesia complications

## 2012-09-09 NOTE — Anesthesia Preprocedure Evaluation (Addendum)
Anesthesia Evaluation  Patient identified by MRN, date of birth, ID band Patient awake    Reviewed: Allergy & Precautions, H&P , NPO status , Patient's Chart, lab work & pertinent test results, reviewed documented beta blocker date and time   Airway Mallampati: II TM Distance: >3 FB Neck ROM: Full    Dental  (+) Teeth Intact   Pulmonary former smoker,    Pulmonary exam normal       Cardiovascular hypertension,     Neuro/Psych negative neurological ROS  negative psych ROS   GI/Hepatic Neg liver ROS, GERD-  Controlled,  Endo/Other  diabetes, Oral Hypoglycemic Agents  Renal/GU negative Renal ROS     Musculoskeletal   Abdominal   Peds  Hematology   Anesthesia Other Findings   Reproductive/Obstetrics                          Anesthesia Physical Anesthesia Plan  ASA: II  Anesthesia Plan: General   Post-op Pain Management:    Induction: Intravenous  Airway Management Planned: LMA  Additional Equipment:   Intra-op Plan:   Post-operative Plan: Extubation in OR  Informed Consent: I have reviewed the patients History and Physical, chart, labs and discussed the procedure including the risks, benefits and alternatives for the proposed anesthesia with the patient or authorized representative who has indicated his/her understanding and acceptance.   Dental advisory given  Plan Discussed with: CRNA, Anesthesiologist and Surgeon  Anesthesia Plan Comments:        Anesthesia Quick Evaluation

## 2012-09-09 NOTE — Transfer of Care (Signed)
Immediate Anesthesia Transfer of Care Note  Patient: Penny Jackson  Procedure(s) Performed: Procedure(s): RELEASE TRIGGER FINGER/A-1 PULLEY LEFT THUMB FIRST DORSAL COMPARTMENT RELEASE LEFT (Left)  Patient Location: PACU  Anesthesia Type:General  Level of Consciousness: awake and alert   Airway & Oxygen Therapy: Patient Spontanous Breathing and Patient connected to face mask oxygen  Post-op Assessment: Report given to PACU RN and Post -op Vital signs reviewed and stable  Post vital signs: Reviewed and stable  Complications: No apparent anesthesia complications

## 2012-09-10 ENCOUNTER — Encounter (HOSPITAL_BASED_OUTPATIENT_CLINIC_OR_DEPARTMENT_OTHER): Payer: Self-pay | Admitting: Orthopedic Surgery

## 2012-09-10 NOTE — Op Note (Signed)
NAMEGARNET, CHATMON NO.:  [id number]  MEDICAL RECORD NO.:  [id number]  LOCATION:                               FACILITY:  [facility]  PHYSICIAN:  Katy Fitch. Loree Shehata, M.D. DATE OF BIRTH:  03/14/1945  DATE OF PROCEDURE:  09/09/2012 DATE OF DISCHARGE:  09/09/2012                              OPERATIVE REPORT   PREOPERATIVE DIAGNOSES: 1. 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 2. Chronic left first dorsal compartment stenosing tenosynovitis with     a 20-year history of type 2 diabetes.  POSTOPERATIVE DIAGNOSIS: 1. 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 2. Chronic left first dorsal compartment stenosing tenosynovitis with     a 20-year history of type 2 diabetes.  OPERATION: 1. Release of left thumb A1 pulley. 2. Exploration of left first dorsal compartment with release of     compartment including excision of septum between extensor pollicis     brevis and abductor pollicis longus tendons.  OPERATING SURGEON:  Katy Fitch. Rafi Kenneth, M.D.  ASSISTANT:  Surgical nurse.  ANESTHESIA:  General by LMA.  SUPERVISING ANESTHESIOLOGIST:  Dr. Krista Blue.  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  She has had significant right first dorsal compartment stenosing tenosynovitis treated with prior surgical release and recently returned for evaluation and management of a painful left wrist and thumb.  She was noted to have a locked stenosing tenosynovitis of her left thumb flexor pollicis longus tendon at the A1 pulley and swelling of her left first dorsal compartment.  We told her that we would carefully monitor the progress of her first dorsal compartment.  She did not respond to nonoperative measures.  She requests that we proceed with release of left thumb A1 pulley and we decided to reexamine her hand at the  surgical center to decide whether or not to release the left first dorsal compartment.  Upon examination in the holding area on September 09, 2012, she was noted to have a locked left trigger thumb with pain.  She was very tender over the A1 pulley.  She also was noted to be tender over the first dorsal compartment with a positive Finkelstein maneuver.  We decided as a team to proceed with release of the first dorsal compartment at this time.  After informed consent, she was brought to the operating room.  Preoperatively, she was interviewed by Dr. Krista Blue of Anesthesia.  A decision to proceed with general anesthesia by LMA technique was accomplished.  Questions were invited and answered in detail.  DESCRIPTION OF PROCEDURE:  [name] was brought to room 6 of the [facility] and placed supine position on the operating table.  Under Dr. Robina Ade direct supervision, general anesthesia by LMA technique was induced.  The left arm was prepped with Betadine soap and solution, sterilely draped.  A pneumatic tourniquet was applied to the proximal left brachium.  Following exsanguination of left arm with Esmarch bandage, an arterial tourniquet and the proximal brachium was inflated to 220 mmHg. Procedure commenced with a routine surgical time-out.  We then proceeded to  perform a short transverse incision directly over the palpably thickened A1 pulley of the thumb.  Subcutaneous tissues were notable for marked fibrosis.  The A1 pulley was isolated and Ragnell retractors placed to protect the neurovascular structures.  The A1 pulley was released with scalpel and scissors.  Thereafter, the flexor pollicis longus tendon was delivered and found to be swollen but otherwise normal.  Full IP range of motion was recovered.  The wound was then repaired with intradermal 4-0 Prolene suture with Steri-Strips.  Attention was then directed to the first dorsal compartment region.  A  short transverse incision was fashioned directly over the apex of the compartment.  Subcutaneous tissues were carefully divided taking care to identify and inventory of the radial superficial sensory branches.  The Therapist, nutritional was used to clear soft tissues.  The first dorsal compartment wall thickness was increased to more than 4 mm due to chronic diabetes and stenosing tenosynovitis.  We carefully identified the mid superficial sensory branches and after retracting them with blunt retractors  proceeded to release the compartment with scalpel and scissors.  There were 2 large slips of the abductor pollicis longus and a separate compartment for the extensor pollicis brevis.  The septum was incised and subsequently removed with the rongeur.  Thereafter, free range of motion of the thumb, CMC and MP joint was recovered as well as wrist motion.  The wound was inspected for bleeding points followed by repair of the skin with intradermal 4-0 Prolene and Steri-Strips.  2% lidocaine was infiltrated into both wounds for postoperative comfort.  A compressive dressing was applied with sterile gauze and Coban.  There were no apparent complications.     Katy Fitch Saverio Kader, M.D.     RVS/MEDQ  D:  09/09/2012  T:  09/09/2012  Job:  [id number]  cc:   Magnus Sinning) Tenny Craw, M.D.

## 2013-03-23 ENCOUNTER — Encounter: Payer: Self-pay | Admitting: Interventional Cardiology

## 2013-03-23 DIAGNOSIS — I10 Essential (primary) hypertension: Secondary | ICD-10-CM | POA: Insufficient documentation

## 2013-03-23 DIAGNOSIS — K219 Gastro-esophageal reflux disease without esophagitis: Secondary | ICD-10-CM | POA: Insufficient documentation

## 2013-03-23 DIAGNOSIS — Z973 Presence of spectacles and contact lenses: Secondary | ICD-10-CM | POA: Insufficient documentation

## 2013-03-30 ENCOUNTER — Encounter: Payer: Self-pay | Admitting: Interventional Cardiology

## 2013-03-30 ENCOUNTER — Ambulatory Visit (INDEPENDENT_AMBULATORY_CARE_PROVIDER_SITE_OTHER): Payer: Medicare FFS | Admitting: Interventional Cardiology

## 2013-03-30 VITALS — BP 146/86 | HR 75 | Ht 66.5 in | Wt 191.8 lb

## 2013-03-30 DIAGNOSIS — E119 Type 2 diabetes mellitus without complications: Secondary | ICD-10-CM | POA: Insufficient documentation

## 2013-03-30 DIAGNOSIS — I10 Essential (primary) hypertension: Secondary | ICD-10-CM

## 2013-03-30 DIAGNOSIS — R9431 Abnormal electrocardiogram [ECG] [EKG]: Secondary | ICD-10-CM

## 2013-03-30 NOTE — Addendum Note (Signed)
Addended by: Verdis PrimeSMITH, HENRY on: 03/30/2013 07:28 PM   Modules accepted: Level of Service

## 2013-03-30 NOTE — Patient Instructions (Signed)
Your physician recommends that you continue on your current medications as directed. Please refer to the Current Medication list given to you today.  Your physician has requested that you have an exercise tolerance test. For further information please visit [email or web address]. Please also follow instruction sheet, as given.  Your physician recommends that you schedule a follow-up appointment in: pending stress test

## 2013-03-30 NOTE — Progress Notes (Signed)
Patient ID: Penny Jackson, female   DOB: 06-04-1945, 68 y.o.   MRN: [id number] Past Medical History  AODM   HTN  Diffuse T wave flattening on ECG    Date: 03/30/2013 ID: Penny Jackson, DOB Mar 23, 1945, MRN [id number] PCP:  Duane Lope, MD  Reason: Diffuse T-wave flattening on EKG  ASSESSMENT;  1. Nonspecific T-wave abnormality on EKG 2. Diabetes mellitus 3. Hypertension  PLAN:  1. Exercise treadmill test   SUBJECTIVE: Penny Jackson is a 68 y.o. female who is here for evaluation of an abnormal EKG. Abnormality includes poor R-wave wave progression and nonspecific diffuse T wave flattening. There are no cardiopulmonary complaints. She denies claudication. There has been lower extremity swelling left greater than right with stasis dermatitis. This is now improved with compression stockings. She is also using a steroid cream for the the skin reaction. She denies orthopnea, PND, exertional dyspnea.   No Known Allergies  Current Outpatient Prescriptions on File Prior to Visit  Medication Sig Dispense Refill  . glipiZIDE (GLUCOTROL XL) 5 MG 24 hr tablet Take 5 mg by mouth daily.      . metFORMIN (GLUMETZA) 500 MG (MOD) 24 hr tablet Take 500 mg by mouth daily with breakfast.      . valsartan-hydrochlorothiazide (DIOVAN-HCT) 320-25 MG per tablet Take 1 tablet by mouth daily.       No current facility-administered medications on file prior to visit.    Past Medical History  Diagnosis Date  . Hypertension   . Diabetes mellitus   . GERD (gastroesophageal reflux disease)   . Wears glasses     Past Surgical History  Procedure Laterality Date  . Abdominal hysterectomy  1997  . Dorsal compartment release  03/13/2011    Procedure: RELEASE DORSAL COMPARTMENT (DEQUERVAIN);  Surgeon: Wyn Forster., MD;  Location: [facility];  Service: Orthopedics;  Laterality: Right;  release 1st dorsal compartment on right  . Trigger finger release Left 09/09/2012   Procedure: RELEASE TRIGGER FINGER/A-1 PULLEY LEFT THUMB FIRST DORSAL COMPARTMENT RELEASE LEFT;  Surgeon: Wyn Forster., MD;  Location: [facility];  Service: Orthopedics;  Laterality: Left;    History   Social History  . Marital Status: Married    Spouse Name: N/A    Number of Children: N/A  . Years of Education: N/A   Occupational History  . Not on file.   Social History Main Topics  . Smoking status: Former Smoker    Quit date: 03/08/1985  . Smokeless tobacco: Not on file  . Alcohol Use: No  . Drug Use: No  . Sexual Activity: Not on file   Other Topics Concern  . Not on file   Social History Narrative  . No narrative on file    Family History  Problem Relation Age of Onset  . Heart disease Mother   . Diabetes Mother   . Heart disease Father     ROS: Remote smoker. No cough. No chest discomfort. 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 No palpitations or syncope.. Other systems negative for complaints.  OBJECTIVE: BP 146/86  Pulse 75  Ht 5' 6.5" (1.689 m)  Wt 191 lb 12.8 oz (87 kg)  BMI 30.50 kg/m2,  General: No acute distress, healthy HEENT: normal flat Neck: JVD absent. Carotids 2+ Chest: Clear Cardiac: Murmur: Absent. Gallop: S4. Rhythm: Normal. Other: Normal Abdomen: Bruit: Absent. Pulsation: Absent Extremities: Edema: 1+ left trace to 1+ right. Pulses: 1+ bilateral Neuro: Normal Psych: Normal  ECG:  Normal sinus rhythm with poor R-wave progression leftward axis and nonspecific T wave flattening, diffuse

## 2013-05-11 ENCOUNTER — Encounter: Payer: Medicare FFS | Admitting: Nurse Practitioner

## 2014-03-03 DIAGNOSIS — H5201 Hypermetropia, right eye: Secondary | ICD-10-CM | POA: Diagnosis not present

## 2014-03-03 DIAGNOSIS — H52221 Regular astigmatism, right eye: Secondary | ICD-10-CM | POA: Diagnosis not present

## 2014-03-03 DIAGNOSIS — H524 Presbyopia: Secondary | ICD-10-CM | POA: Diagnosis not present

## 2014-03-03 DIAGNOSIS — H2513 Age-related nuclear cataract, bilateral: Secondary | ICD-10-CM | POA: Diagnosis not present

## 2014-03-03 DIAGNOSIS — E11329 Type 2 diabetes mellitus with mild nonproliferative diabetic retinopathy without macular edema: Secondary | ICD-10-CM | POA: Diagnosis not present

## 2014-03-03 DIAGNOSIS — H33311 Horseshoe tear of retina without detachment, right eye: Secondary | ICD-10-CM | POA: Diagnosis not present

## 2014-09-07 DIAGNOSIS — M2011 Hallux valgus (acquired), right foot: Secondary | ICD-10-CM | POA: Diagnosis not present

## 2014-09-07 DIAGNOSIS — D2371 Other benign neoplasm of skin of right lower limb, including hip: Secondary | ICD-10-CM | POA: Diagnosis not present

## 2014-09-28 DIAGNOSIS — E11329 Type 2 diabetes mellitus with mild nonproliferative diabetic retinopathy without macular edema: Secondary | ICD-10-CM | POA: Diagnosis not present

## 2014-09-28 DIAGNOSIS — H2513 Age-related nuclear cataract, bilateral: Secondary | ICD-10-CM | POA: Diagnosis not present

## 2014-09-28 DIAGNOSIS — H33311 Horseshoe tear of retina without detachment, right eye: Secondary | ICD-10-CM | POA: Diagnosis not present

## 2014-11-02 DIAGNOSIS — D2371 Other benign neoplasm of skin of right lower limb, including hip: Secondary | ICD-10-CM | POA: Diagnosis not present

## 2014-11-02 DIAGNOSIS — I890 Lymphedema, not elsewhere classified: Secondary | ICD-10-CM | POA: Diagnosis not present

## 2014-11-15 DIAGNOSIS — Z23 Encounter for immunization: Secondary | ICD-10-CM | POA: Diagnosis not present

## 2014-11-15 DIAGNOSIS — I10 Essential (primary) hypertension: Secondary | ICD-10-CM | POA: Diagnosis not present

## 2014-11-15 DIAGNOSIS — E11319 Type 2 diabetes mellitus with unspecified diabetic retinopathy without macular edema: Secondary | ICD-10-CM | POA: Diagnosis not present

## 2014-11-16 DIAGNOSIS — H25012 Cortical age-related cataract, left eye: Secondary | ICD-10-CM | POA: Diagnosis not present

## 2014-11-16 DIAGNOSIS — H25011 Cortical age-related cataract, right eye: Secondary | ICD-10-CM | POA: Diagnosis not present

## 2014-11-16 DIAGNOSIS — H2512 Age-related nuclear cataract, left eye: Secondary | ICD-10-CM | POA: Diagnosis not present

## 2014-11-16 DIAGNOSIS — H2511 Age-related nuclear cataract, right eye: Secondary | ICD-10-CM | POA: Diagnosis not present

## 2014-12-21 DIAGNOSIS — M792 Neuralgia and neuritis, unspecified: Secondary | ICD-10-CM | POA: Diagnosis not present

## 2014-12-21 DIAGNOSIS — D2371 Other benign neoplasm of skin of right lower limb, including hip: Secondary | ICD-10-CM | POA: Diagnosis not present

## 2015-02-22 DIAGNOSIS — L84 Corns and callosities: Secondary | ICD-10-CM | POA: Diagnosis not present

## 2015-02-22 DIAGNOSIS — I739 Peripheral vascular disease, unspecified: Secondary | ICD-10-CM | POA: Diagnosis not present

## 2015-02-22 DIAGNOSIS — E1151 Type 2 diabetes mellitus with diabetic peripheral angiopathy without gangrene: Secondary | ICD-10-CM | POA: Diagnosis not present

## 2016-05-14 ENCOUNTER — Other Ambulatory Visit (HOSPITAL_COMMUNITY): Payer: Self-pay | Admitting: Podiatry

## 2016-05-14 DIAGNOSIS — M7989 Other specified soft tissue disorders: Principal | ICD-10-CM

## 2016-05-14 DIAGNOSIS — M79605 Pain in left leg: Secondary | ICD-10-CM

## 2016-05-15 ENCOUNTER — Ambulatory Visit (HOSPITAL_COMMUNITY)
Admission: RE | Admit: 2016-05-15 | Discharge: 2016-05-15 | Disposition: A | Discharge status: Home / Self Care | Payer: Medicare Other | Source: Ambulatory Visit | Attending: Family Medicine | Admitting: Family Medicine

## 2016-05-15 DIAGNOSIS — M7989 Other specified soft tissue disorders: Secondary | ICD-10-CM

## 2016-05-15 DIAGNOSIS — M79605 Pain in left leg: Secondary | ICD-10-CM | POA: Diagnosis not present

## 2016-05-15 NOTE — Progress Notes (Signed)
Preliminary Vascular results.  Vascular ultrasound: LE venous duplex Left: No evidence of DVT or baker's cyst.  Called results to Dr. Elvin SoPetery office. Spoke with medical assistant who will give results to MD.   Farrel DemarkJill Eunice, RDMS, RVT 05/15/2016

## 2016-06-22 ENCOUNTER — Encounter (HOSPITAL_COMMUNITY): Payer: Medicare Other

## 2016-06-25 ENCOUNTER — Other Ambulatory Visit: Payer: Self-pay

## 2016-06-25 DIAGNOSIS — L97929 Non-pressure chronic ulcer of unspecified part of left lower leg with unspecified severity: Secondary | ICD-10-CM

## 2016-07-10 ENCOUNTER — Encounter (HOSPITAL_COMMUNITY): Payer: Self-pay

## 2016-07-10 ENCOUNTER — Ambulatory Visit (HOSPITAL_COMMUNITY)
Admission: RE | Admit: 2016-07-10 | Discharge: 2016-07-10 | Disposition: A | Discharge status: Home / Self Care | Payer: Medicare Other | Source: Ambulatory Visit | Attending: Vascular Surgery | Admitting: Vascular Surgery

## 2016-07-10 DIAGNOSIS — L97929 Non-pressure chronic ulcer of unspecified part of left lower leg with unspecified severity: Secondary | ICD-10-CM

## 2016-07-10 DIAGNOSIS — E119 Type 2 diabetes mellitus without complications: Secondary | ICD-10-CM | POA: Diagnosis not present

## 2016-07-10 DIAGNOSIS — Z87891 Personal history of nicotine dependence: Secondary | ICD-10-CM | POA: Diagnosis not present

## 2016-07-10 DIAGNOSIS — I10 Essential (primary) hypertension: Secondary | ICD-10-CM | POA: Insufficient documentation

## 2018-04-18 ENCOUNTER — Other Ambulatory Visit: Payer: Self-pay | Admitting: Family Medicine

## 2018-04-18 DIAGNOSIS — M8580 Other specified disorders of bone density and structure, unspecified site: Secondary | ICD-10-CM

## 2018-04-18 DIAGNOSIS — Z1231 Encounter for screening mammogram for malignant neoplasm of breast: Secondary | ICD-10-CM

## 2018-04-18 DIAGNOSIS — M859 Disorder of bone density and structure, unspecified: Secondary | ICD-10-CM

## 2018-07-14 ENCOUNTER — Other Ambulatory Visit: Payer: Medicare Other

## 2018-07-14 ENCOUNTER — Ambulatory Visit: Payer: Medicare Other

## 2018-09-03 ENCOUNTER — Ambulatory Visit: Payer: Medicare Other

## 2018-09-03 ENCOUNTER — Other Ambulatory Visit: Payer: Medicare Other

## 2018-10-28 HISTORY — PX: BREAST BIOPSY: SHX20

## 2018-11-10 ENCOUNTER — Ambulatory Visit
Admission: RE | Admit: 2018-11-10 | Discharge: 2018-11-10 | Disposition: A | Discharge status: Home / Self Care | Payer: Medicare Other | Source: Ambulatory Visit | Attending: Family Medicine | Admitting: Family Medicine

## 2018-11-10 ENCOUNTER — Other Ambulatory Visit: Payer: Self-pay

## 2018-11-10 DIAGNOSIS — M859 Disorder of bone density and structure, unspecified: Secondary | ICD-10-CM

## 2018-11-10 DIAGNOSIS — Z1231 Encounter for screening mammogram for malignant neoplasm of breast: Secondary | ICD-10-CM

## 2018-11-10 DIAGNOSIS — M8580 Other specified disorders of bone density and structure, unspecified site: Secondary | ICD-10-CM

## 2018-11-12 ENCOUNTER — Other Ambulatory Visit: Payer: Self-pay | Admitting: Family Medicine

## 2018-11-12 DIAGNOSIS — R928 Other abnormal and inconclusive findings on diagnostic imaging of breast: Secondary | ICD-10-CM

## 2018-11-17 ENCOUNTER — Ambulatory Visit
Admission: RE | Admit: 2018-11-17 | Discharge: 2018-11-17 | Disposition: A | Discharge status: Home / Self Care | Payer: Medicare Other | Source: Ambulatory Visit | Attending: Family Medicine | Admitting: Family Medicine

## 2018-11-17 ENCOUNTER — Other Ambulatory Visit: Payer: Self-pay

## 2018-11-17 ENCOUNTER — Other Ambulatory Visit: Payer: Self-pay | Admitting: Family Medicine

## 2018-11-17 DIAGNOSIS — N6320 Unspecified lump in the left breast, unspecified quadrant: Secondary | ICD-10-CM

## 2018-11-17 DIAGNOSIS — R928 Other abnormal and inconclusive findings on diagnostic imaging of breast: Secondary | ICD-10-CM

## 2018-11-17 DIAGNOSIS — R921 Mammographic calcification found on diagnostic imaging of breast: Secondary | ICD-10-CM

## 2018-11-19 ENCOUNTER — Ambulatory Visit
Admission: RE | Admit: 2018-11-19 | Discharge: 2018-11-19 | Disposition: A | Discharge status: Home / Self Care | Payer: Medicare Other | Source: Ambulatory Visit | Attending: Family Medicine | Admitting: Family Medicine

## 2018-11-19 ENCOUNTER — Other Ambulatory Visit: Payer: Self-pay | Admitting: Diagnostic Radiology

## 2018-11-19 ENCOUNTER — Other Ambulatory Visit: Payer: Self-pay

## 2018-11-19 DIAGNOSIS — N6320 Unspecified lump in the left breast, unspecified quadrant: Secondary | ICD-10-CM

## 2019-03-30 ENCOUNTER — Ambulatory Visit: Payer: Medicare PPO | Admitting: Podiatry

## 2019-03-30 ENCOUNTER — Ambulatory Visit (INDEPENDENT_AMBULATORY_CARE_PROVIDER_SITE_OTHER): Payer: Medicare PPO

## 2019-03-30 ENCOUNTER — Other Ambulatory Visit: Payer: Self-pay

## 2019-03-30 DIAGNOSIS — M205X1 Other deformities of toe(s) (acquired), right foot: Secondary | ICD-10-CM | POA: Diagnosis not present

## 2019-03-30 DIAGNOSIS — M79671 Pain in right foot: Secondary | ICD-10-CM

## 2019-03-30 NOTE — Patient Instructions (Signed)
Pre-Operative Instructions  Congratulations, you have decided to take an important step towards improving your quality of life.  You can be assured that the doctors and staff at [facility] will be with you every step of the way.  Here are some important things you should know:  1. Plan to be at the surgery center/hospital at least 1 (one) hour prior to your scheduled time, unless otherwise directed by the surgical center/hospital staff.  You must have a responsible adult accompany you, remain during the surgery and drive you home.  Make sure you have directions to the surgical center/hospital to ensure you arrive on time. 2. If you are having surgery at [facility] or [facility], you will need a copy of your medical history and physical form from your family physician within one month prior to the date of surgery. We will give you a form for your primary physician to complete.  3. We make every effort to accommodate the date you request for surgery.  However, there are times where surgery dates or times have to be moved.  We will contact you as soon as possible if a change in schedule is required.   4. No aspirin/ibuprofen for one week before surgery.  If you are on aspirin, any non-steroidal anti-inflammatory medications (Mobic, Aleve, Ibuprofen) should not be taken seven (7) days prior to your surgery.  You make take Tylenol for pain prior to surgery.  5. Medications - If you are taking daily heart and blood pressure medications, seizure, reflux, allergy, asthma, anxiety, pain or diabetes medications, make sure you notify the surgery center/hospital before the day of surgery so they can tell you which medications you should take or avoid the day of surgery. 6. No food or drink after midnight the night before surgery unless directed otherwise by surgical center/hospital staff. 7. No alcoholic beverages 24-hours prior to surgery.  No smoking 24-hours prior or 24-hours after  surgery. 8. Wear loose pants or shorts. They should be loose enough to fit over bandages, boots, and casts. 9. Don't wear slip-on shoes. Sneakers are preferred. 10. Bring your boot with you to the surgery center/hospital.  Also bring crutches or a walker if your physician has prescribed it for you.  If you do not have this equipment, it will be provided for you after surgery. 11. If you have not been contacted by the surgery center/hospital by the day before your surgery, call to confirm the date and time of your surgery. 12. Leave-time from work may vary depending on the type of surgery you have.  Appropriate arrangements should be made prior to surgery with your employer. 13. Prescriptions will be provided immediately following surgery by your doctor.  Fill these as soon as possible after surgery and take the medication as directed. Pain medications will not be refilled on weekends and must be approved by the doctor. 14. Remove nail polish on the operative foot and avoid getting pedicures prior to surgery. 15. Wash the night before surgery.  The night before surgery wash the foot and leg well with water and the antibacterial soap provided. Be sure to pay special attention to beneath the toenails and in between the toes.  Wash for at least three (3) minutes. Rinse thoroughly with water and dry well with a towel.  Perform this wash unless told not to do so by your physician.  Enclosed: 1 Ice pack (please put in freezer the night before surgery)   1 Hibiclens skin cleaner     Pre-op instructions  If you have any questions regarding the instructions, please do not hesitate to call our office.  [place]: [address], [place], [place] [address] -- [phone]  [place]: [address] [place], [place] [address] -- [phone]  [place]: [address], [place], [place] [address] -- [phone]   Website: [email or web address] 

## 2019-04-02 NOTE — Progress Notes (Signed)
   HPI: 74 y.o. female with PMHx of diabetes mellitus presenting today as a new patient, referred by Dr. Elijah Birk, with a chief complaint of soreness of the right great toe that has been gradually worsening for the past 8 years. She also reports a lesion between the great toe and 2nd toe of the right foot. Walking and wearing shoes increases the pain. She has been using OTC creams, trimming the area and using toe spacers for treatment. Patient is here for further evaluation and treatment.   Past Medical History:  Diagnosis Date  . Diabetes mellitus   . GERD (gastroesophageal reflux disease)   . Hypertension   . Wears glasses      Physical Exam: General: The patient is alert and oriented x3 in no acute distress.  Dermatology: Interdigital corn noted to the right great toe. Skin is warm, dry and supple bilateral lower extremities.   Vascular: Palpable pedal pulses bilaterally. No edema or erythema noted. Capillary refill within normal limits.  Neurological: Epicritic and protective threshold grossly intact bilaterally.   Musculoskeletal Exam: Pain on palpation with limited range of motion noted to the first MPJ right foot.  Radiographic Exam: Degenerative changes noted with joint space narrowing first MPJ. There also appears to be extra-articular spurring noted about the joint.    Assessment: 1. Hallux limitus right  2. Interdigital corn right great toe    Plan of Care:  1. Patient evaluated. X-Rays reviewed.  2. Today we discussed the conservative versus surgical management of the presenting pathology. The patient opts for surgical management. All possible complications and details of the procedure were explained. All patient questions were answered. No guarantees were expressed or implied. 3. Authorization for surgery was initiated today. Surgery will consist of right great toe arthroplasty with implant.  4. Return to clinic one week post op.      Felecia Shelling, DPM [facility]  Dr. Felecia Shelling, DPM    [address] [place], [place] [address]                Office [phone]  Fax [phone]

## 2019-04-03 ENCOUNTER — Other Ambulatory Visit: Payer: Self-pay | Admitting: Podiatry

## 2019-04-03 DIAGNOSIS — M205X1 Other deformities of toe(s) (acquired), right foot: Secondary | ICD-10-CM

## 2019-04-30 ENCOUNTER — Telehealth: Payer: Self-pay

## 2019-04-30 NOTE — Telephone Encounter (Signed)
DOS 05/21/19  KELLER BUNION IMPLANT RT - [phone]  [facility] EFFECTIVE DATE - 02/27/19  RECEIVED PRE-CERT FROM [facility] - AUTH # [id number]

## 2019-05-21 ENCOUNTER — Encounter: Payer: Self-pay | Admitting: Podiatry

## 2019-05-21 DIAGNOSIS — M205X1 Other deformities of toe(s) (acquired), right foot: Secondary | ICD-10-CM | POA: Diagnosis not present

## 2019-05-21 NOTE — Addendum Note (Signed)
Addended by: Nicholes Rough on: 05/21/2019 11:20 AM   Modules accepted: Orders

## 2019-05-22 ENCOUNTER — Other Ambulatory Visit: Payer: Self-pay | Admitting: Podiatry

## 2019-05-22 ENCOUNTER — Telehealth: Payer: Self-pay | Admitting: Podiatry

## 2019-05-22 MED ORDER — OXYCODONE-ACETAMINOPHEN 5-325 MG PO TABS: 1.0000 | ORAL_TABLET | Freq: Four times a day (QID) | ORAL | 0 refills | Status: DC | PRN

## 2019-05-22 NOTE — Telephone Encounter (Signed)
Pt called to say that she has not received any pain medications from her procedure 05/21/19 wants to know if there will be any called to her pharmacy [facility]

## 2019-05-22 NOTE — Telephone Encounter (Signed)
Rx sent earlier today. - Dr. Logan Bores

## 2019-05-22 NOTE — Progress Notes (Signed)
PRN postop 

## 2019-05-27 ENCOUNTER — Other Ambulatory Visit: Payer: Self-pay

## 2019-05-27 ENCOUNTER — Ambulatory Visit (INDEPENDENT_AMBULATORY_CARE_PROVIDER_SITE_OTHER): Payer: Medicare PPO

## 2019-05-27 ENCOUNTER — Ambulatory Visit (INDEPENDENT_AMBULATORY_CARE_PROVIDER_SITE_OTHER): Payer: Medicare PPO | Admitting: Podiatry

## 2019-05-27 DIAGNOSIS — M205X1 Other deformities of toe(s) (acquired), right foot: Secondary | ICD-10-CM

## 2019-05-27 DIAGNOSIS — Z9889 Other specified postprocedural states: Secondary | ICD-10-CM

## 2019-06-03 ENCOUNTER — Other Ambulatory Visit: Payer: Self-pay

## 2019-06-03 ENCOUNTER — Ambulatory Visit (INDEPENDENT_AMBULATORY_CARE_PROVIDER_SITE_OTHER): Payer: Medicare PPO | Admitting: Podiatry

## 2019-06-03 DIAGNOSIS — M205X1 Other deformities of toe(s) (acquired), right foot: Secondary | ICD-10-CM

## 2019-06-03 DIAGNOSIS — Z9889 Other specified postprocedural states: Secondary | ICD-10-CM

## 2019-06-03 NOTE — Progress Notes (Signed)
   Subjective:  Patient presents today status post right 1st MPJ implant. DOS: 05/21/2019. She states she is doing well overall. She reports some pain when applying pressure to the foot. She has been using the CAM boot as directed. Patient is here for further evaluation and treatment.    Past Medical History:  Diagnosis Date  . Diabetes mellitus   . GERD (gastroesophageal reflux disease)   . Hypertension   . Wears glasses       Objective/Physical Exam Neurovascular status intact.  Skin incisions appear to be well coapted with sutures and staples intact. No sign of infectious process noted. No dehiscence. No active bleeding noted. Moderate edema noted to the surgical extremity.  Radiographic Exam:  Orthopedic hardware and osteotomies sites appear to be stable with routine healing.  Assessment: 1. s/p right 1st MPJ implant. DOS: 05/21/2019   Plan of Care:  1. Patient was evaluated. X-rays reviewed 2. Dressing changed. Keep clean, dry and intact for one week.  3. Continue weightbearing in CAM boot.  4. Return to clinic in one week for staple removal.    Felecia Shelling, DPM [facility]  Dr. Felecia Shelling, DPM    [address]                                        [place], [place] [address]                Office [phone]  Fax [phone]

## 2019-06-07 NOTE — Progress Notes (Signed)
   Subjective:  Patient presents today status post right 1st MPJ implant. DOS: 05/21/2019. She states she is doing well. She denies any pain and states she has not taken any pain medication in two days. She has been using the CAM boot and elevating the foot for treatment. Patient is here for further evaluation and treatment.   Past Medical History:  Diagnosis Date  . Diabetes mellitus   . GERD (gastroesophageal reflux disease)   . Hypertension   . Wears glasses       Objective/Physical Exam Neurovascular status intact.  Skin incisions appear to be well coapted with sutures and staples intact. No sign of infectious process noted. No dehiscence. No active bleeding noted. Moderate edema noted to the surgical extremity.  Assessment: 1. s/p right 1st MPJ implant. DOS: 05/21/2019   Plan of Care:  1. Patient was evaluated.  2. Staples removed.  3. Continue using CAM boot.  4. Ace wrap dispensed.  5. Return to clinic in 2 weeks.    Felecia Shelling, DPM [facility]  Dr. Felecia Shelling, DPM    [address]                                        [place], [place] [address]                Office [phone]  Fax [phone]

## 2019-06-17 ENCOUNTER — Ambulatory Visit (INDEPENDENT_AMBULATORY_CARE_PROVIDER_SITE_OTHER): Payer: Medicare PPO | Admitting: Podiatry

## 2019-06-17 ENCOUNTER — Other Ambulatory Visit: Payer: Self-pay

## 2019-06-17 DIAGNOSIS — Z9889 Other specified postprocedural states: Secondary | ICD-10-CM

## 2019-06-17 DIAGNOSIS — M205X1 Other deformities of toe(s) (acquired), right foot: Secondary | ICD-10-CM

## 2019-06-22 NOTE — Progress Notes (Signed)
   Subjective:  Patient presents today status post right 1st MPJ implant. DOS: 05/21/2019. She states she is doing well. She reports some peeling skin and soreness of the foot. She has been using the CAM boot as directed. There are no modifying factors noted.  She also has a new complaint of throbbing pain to the lateral right hallux secondary to a painful corn that has been present for the past few years. She states her toes are rubbing together which increases the pain. She has had the area shaved down in the past for treatment. Patient is here for further evaluation and treatment.   Past Medical History:  Diagnosis Date  . Diabetes mellitus   . GERD (gastroesophageal reflux disease)   . Hypertension   . Wears glasses       Objective/Physical Exam Neurovascular status intact.  Skin incisions appear to be well coapted. No sign of infectious process noted. No dehiscence. No active bleeding noted. Moderate edema noted to the surgical extremity. Hyperkeratotic lesion(s) present on the lateral right hallux. Pain on palpation with a central nucleated core noted.   Assessment: 1. s/p right 1st MPJ implant. DOS: 05/21/2019 2. Porokeratosis right lateral hallux    Plan of Care:  1. Patient was evaluated.  2. Recommended ROM exercises.  3. Discontinue using CAM boot. Recommended good shoe gear.  4. Recommended OTC corn and callus remover daily.  5. Return to clinic in 4 weeks.    Felecia Shelling, DPM [facility]  Dr. Felecia Shelling, DPM    [address]                                        [place], [place] [address]                Office [phone]  Fax [phone]

## 2019-07-15 ENCOUNTER — Other Ambulatory Visit: Payer: Self-pay

## 2019-07-15 ENCOUNTER — Ambulatory Visit (INDEPENDENT_AMBULATORY_CARE_PROVIDER_SITE_OTHER): Payer: Medicare PPO

## 2019-07-15 ENCOUNTER — Ambulatory Visit (INDEPENDENT_AMBULATORY_CARE_PROVIDER_SITE_OTHER): Payer: Medicare PPO | Admitting: Podiatry

## 2019-07-15 DIAGNOSIS — M205X1 Other deformities of toe(s) (acquired), right foot: Secondary | ICD-10-CM

## 2019-07-15 DIAGNOSIS — Z7409 Other reduced mobility: Secondary | ICD-10-CM

## 2019-07-15 DIAGNOSIS — Z9889 Other specified postprocedural states: Secondary | ICD-10-CM

## 2019-07-19 NOTE — Progress Notes (Signed)
   Subjective:  Patient presents today status post right 1st MPJ implant. DOS: 05/21/2019. She reports some continued intermittent pain of the great toe. She reports associated swelling. Touching the area increases the pain. She has been using the post op shoe for treatment. Patient is here for further evaluation and treatment.   Past Medical History:  Diagnosis Date  . Diabetes mellitus   . GERD (gastroesophageal reflux disease)   . Hypertension   . Wears glasses       Objective/Physical Exam Neurovascular status intact.  Skin incisions appear to be well coapted. No sign of infectious process noted. No dehiscence. No active bleeding noted. Moderate edema noted to the surgical extremity.  Radiographic Exam:  Orthopedic hardware and osteotomies sites appear to be stable with routine healing.  Assessment: 1. s/p right 1st MPJ implant. DOS: 05/21/2019 2. Porokeratosis right lateral hallux - resolved    Plan of Care:  1. Patient was evaluated. X-Rays reviewed.  2. Physical therapy ordered from [facility].  3. Discontinue using post op shoe.  4. Recommended good shoe gear.  5. Compression anklet dispensed.  6. Return to clinic in 6 weeks.    Felecia Shelling, DPM [facility]  Dr. Felecia Shelling, DPM    [address]                                        [place], [place] [address]                Office [phone]  Fax [phone]

## 2019-07-22 DIAGNOSIS — R531 Weakness: Secondary | ICD-10-CM | POA: Diagnosis not present

## 2019-07-22 DIAGNOSIS — R2689 Other abnormalities of gait and mobility: Secondary | ICD-10-CM | POA: Diagnosis not present

## 2019-07-22 DIAGNOSIS — M79674 Pain in right toe(s): Secondary | ICD-10-CM | POA: Diagnosis not present

## 2019-07-22 DIAGNOSIS — M25674 Stiffness of right foot, not elsewhere classified: Secondary | ICD-10-CM | POA: Diagnosis not present

## 2019-07-30 DIAGNOSIS — M79674 Pain in right toe(s): Secondary | ICD-10-CM | POA: Diagnosis not present

## 2019-07-30 DIAGNOSIS — R531 Weakness: Secondary | ICD-10-CM | POA: Diagnosis not present

## 2019-07-30 DIAGNOSIS — M25674 Stiffness of right foot, not elsewhere classified: Secondary | ICD-10-CM | POA: Diagnosis not present

## 2019-07-30 DIAGNOSIS — R2689 Other abnormalities of gait and mobility: Secondary | ICD-10-CM | POA: Diagnosis not present

## 2019-07-31 DIAGNOSIS — R531 Weakness: Secondary | ICD-10-CM | POA: Diagnosis not present

## 2019-07-31 DIAGNOSIS — M25674 Stiffness of right foot, not elsewhere classified: Secondary | ICD-10-CM | POA: Diagnosis not present

## 2019-07-31 DIAGNOSIS — M79674 Pain in right toe(s): Secondary | ICD-10-CM | POA: Diagnosis not present

## 2019-07-31 DIAGNOSIS — R2689 Other abnormalities of gait and mobility: Secondary | ICD-10-CM | POA: Diagnosis not present

## 2019-08-03 DIAGNOSIS — M25674 Stiffness of right foot, not elsewhere classified: Secondary | ICD-10-CM | POA: Diagnosis not present

## 2019-08-03 DIAGNOSIS — R531 Weakness: Secondary | ICD-10-CM | POA: Diagnosis not present

## 2019-08-03 DIAGNOSIS — R2689 Other abnormalities of gait and mobility: Secondary | ICD-10-CM | POA: Diagnosis not present

## 2019-08-03 DIAGNOSIS — M79674 Pain in right toe(s): Secondary | ICD-10-CM | POA: Diagnosis not present

## 2019-08-06 DIAGNOSIS — R531 Weakness: Secondary | ICD-10-CM | POA: Diagnosis not present

## 2019-08-06 DIAGNOSIS — R2689 Other abnormalities of gait and mobility: Secondary | ICD-10-CM | POA: Diagnosis not present

## 2019-08-06 DIAGNOSIS — M25674 Stiffness of right foot, not elsewhere classified: Secondary | ICD-10-CM | POA: Diagnosis not present

## 2019-08-06 DIAGNOSIS — M79674 Pain in right toe(s): Secondary | ICD-10-CM | POA: Diagnosis not present

## 2019-08-11 DIAGNOSIS — R531 Weakness: Secondary | ICD-10-CM | POA: Diagnosis not present

## 2019-08-11 DIAGNOSIS — M79674 Pain in right toe(s): Secondary | ICD-10-CM | POA: Diagnosis not present

## 2019-08-11 DIAGNOSIS — R2689 Other abnormalities of gait and mobility: Secondary | ICD-10-CM | POA: Diagnosis not present

## 2019-08-11 DIAGNOSIS — M25674 Stiffness of right foot, not elsewhere classified: Secondary | ICD-10-CM | POA: Diagnosis not present

## 2019-08-12 DIAGNOSIS — M79674 Pain in right toe(s): Secondary | ICD-10-CM | POA: Diagnosis not present

## 2019-08-12 DIAGNOSIS — M25674 Stiffness of right foot, not elsewhere classified: Secondary | ICD-10-CM | POA: Diagnosis not present

## 2019-08-12 DIAGNOSIS — R2689 Other abnormalities of gait and mobility: Secondary | ICD-10-CM | POA: Diagnosis not present

## 2019-08-12 DIAGNOSIS — R531 Weakness: Secondary | ICD-10-CM | POA: Diagnosis not present

## 2019-08-19 DIAGNOSIS — R2689 Other abnormalities of gait and mobility: Secondary | ICD-10-CM | POA: Diagnosis not present

## 2019-08-19 DIAGNOSIS — R531 Weakness: Secondary | ICD-10-CM | POA: Diagnosis not present

## 2019-08-19 DIAGNOSIS — M79674 Pain in right toe(s): Secondary | ICD-10-CM | POA: Diagnosis not present

## 2019-08-19 DIAGNOSIS — M25674 Stiffness of right foot, not elsewhere classified: Secondary | ICD-10-CM | POA: Diagnosis not present

## 2019-08-21 DIAGNOSIS — R2689 Other abnormalities of gait and mobility: Secondary | ICD-10-CM | POA: Diagnosis not present

## 2019-08-21 DIAGNOSIS — M79674 Pain in right toe(s): Secondary | ICD-10-CM | POA: Diagnosis not present

## 2019-08-21 DIAGNOSIS — M25674 Stiffness of right foot, not elsewhere classified: Secondary | ICD-10-CM | POA: Diagnosis not present

## 2019-08-21 DIAGNOSIS — R531 Weakness: Secondary | ICD-10-CM | POA: Diagnosis not present

## 2019-08-24 DIAGNOSIS — M25674 Stiffness of right foot, not elsewhere classified: Secondary | ICD-10-CM | POA: Diagnosis not present

## 2019-08-24 DIAGNOSIS — R2689 Other abnormalities of gait and mobility: Secondary | ICD-10-CM | POA: Diagnosis not present

## 2019-08-24 DIAGNOSIS — R531 Weakness: Secondary | ICD-10-CM | POA: Diagnosis not present

## 2019-08-24 DIAGNOSIS — M79674 Pain in right toe(s): Secondary | ICD-10-CM | POA: Diagnosis not present

## 2019-08-26 DIAGNOSIS — M25674 Stiffness of right foot, not elsewhere classified: Secondary | ICD-10-CM | POA: Diagnosis not present

## 2019-08-26 DIAGNOSIS — R531 Weakness: Secondary | ICD-10-CM | POA: Diagnosis not present

## 2019-08-26 DIAGNOSIS — R2689 Other abnormalities of gait and mobility: Secondary | ICD-10-CM | POA: Diagnosis not present

## 2019-08-26 DIAGNOSIS — M79674 Pain in right toe(s): Secondary | ICD-10-CM | POA: Diagnosis not present

## 2019-09-02 DIAGNOSIS — R531 Weakness: Secondary | ICD-10-CM | POA: Diagnosis not present

## 2019-09-02 DIAGNOSIS — R2689 Other abnormalities of gait and mobility: Secondary | ICD-10-CM | POA: Diagnosis not present

## 2019-09-02 DIAGNOSIS — M79674 Pain in right toe(s): Secondary | ICD-10-CM | POA: Diagnosis not present

## 2019-09-02 DIAGNOSIS — M25674 Stiffness of right foot, not elsewhere classified: Secondary | ICD-10-CM | POA: Diagnosis not present

## 2019-09-09 DIAGNOSIS — M79674 Pain in right toe(s): Secondary | ICD-10-CM | POA: Diagnosis not present

## 2019-09-09 DIAGNOSIS — R2689 Other abnormalities of gait and mobility: Secondary | ICD-10-CM | POA: Diagnosis not present

## 2019-09-09 DIAGNOSIS — M25674 Stiffness of right foot, not elsewhere classified: Secondary | ICD-10-CM | POA: Diagnosis not present

## 2019-09-09 DIAGNOSIS — R531 Weakness: Secondary | ICD-10-CM | POA: Diagnosis not present

## 2019-09-11 DIAGNOSIS — R531 Weakness: Secondary | ICD-10-CM | POA: Diagnosis not present

## 2019-09-11 DIAGNOSIS — M79674 Pain in right toe(s): Secondary | ICD-10-CM | POA: Diagnosis not present

## 2019-09-11 DIAGNOSIS — M25674 Stiffness of right foot, not elsewhere classified: Secondary | ICD-10-CM | POA: Diagnosis not present

## 2019-09-11 DIAGNOSIS — R2689 Other abnormalities of gait and mobility: Secondary | ICD-10-CM | POA: Diagnosis not present

## 2019-09-14 DIAGNOSIS — M79674 Pain in right toe(s): Secondary | ICD-10-CM | POA: Diagnosis not present

## 2019-09-14 DIAGNOSIS — R2689 Other abnormalities of gait and mobility: Secondary | ICD-10-CM | POA: Diagnosis not present

## 2019-09-14 DIAGNOSIS — M25674 Stiffness of right foot, not elsewhere classified: Secondary | ICD-10-CM | POA: Diagnosis not present

## 2019-09-14 DIAGNOSIS — R531 Weakness: Secondary | ICD-10-CM | POA: Diagnosis not present

## 2019-09-16 DIAGNOSIS — M79674 Pain in right toe(s): Secondary | ICD-10-CM | POA: Diagnosis not present

## 2019-09-16 DIAGNOSIS — R531 Weakness: Secondary | ICD-10-CM | POA: Diagnosis not present

## 2019-09-16 DIAGNOSIS — M25674 Stiffness of right foot, not elsewhere classified: Secondary | ICD-10-CM | POA: Diagnosis not present

## 2019-09-16 DIAGNOSIS — R2689 Other abnormalities of gait and mobility: Secondary | ICD-10-CM | POA: Diagnosis not present

## 2019-09-21 DIAGNOSIS — R531 Weakness: Secondary | ICD-10-CM | POA: Diagnosis not present

## 2019-09-21 DIAGNOSIS — M25674 Stiffness of right foot, not elsewhere classified: Secondary | ICD-10-CM | POA: Diagnosis not present

## 2019-09-21 DIAGNOSIS — R2689 Other abnormalities of gait and mobility: Secondary | ICD-10-CM | POA: Diagnosis not present

## 2019-09-21 DIAGNOSIS — M79674 Pain in right toe(s): Secondary | ICD-10-CM | POA: Diagnosis not present

## 2019-09-23 DIAGNOSIS — M79674 Pain in right toe(s): Secondary | ICD-10-CM | POA: Diagnosis not present

## 2019-09-23 DIAGNOSIS — M25674 Stiffness of right foot, not elsewhere classified: Secondary | ICD-10-CM | POA: Diagnosis not present

## 2019-09-23 DIAGNOSIS — R2689 Other abnormalities of gait and mobility: Secondary | ICD-10-CM | POA: Diagnosis not present

## 2019-09-23 DIAGNOSIS — R531 Weakness: Secondary | ICD-10-CM | POA: Diagnosis not present

## 2019-10-05 ENCOUNTER — Other Ambulatory Visit: Payer: Self-pay | Admitting: Family Medicine

## 2019-10-05 DIAGNOSIS — Z1231 Encounter for screening mammogram for malignant neoplasm of breast: Secondary | ICD-10-CM

## 2019-11-11 ENCOUNTER — Other Ambulatory Visit: Payer: Self-pay

## 2019-11-11 ENCOUNTER — Ambulatory Visit
Admission: RE | Admit: 2019-11-11 | Discharge: 2019-11-11 | Disposition: A | Discharge status: Home / Self Care | Payer: Medicare PPO | Source: Ambulatory Visit | Attending: Family Medicine | Admitting: Family Medicine

## 2019-11-11 DIAGNOSIS — Z1231 Encounter for screening mammogram for malignant neoplasm of breast: Secondary | ICD-10-CM

## 2019-11-24 ENCOUNTER — Other Ambulatory Visit: Payer: Self-pay

## 2019-11-24 ENCOUNTER — Other Ambulatory Visit: Payer: Medicare PPO

## 2019-11-24 DIAGNOSIS — Z20822 Contact with and (suspected) exposure to covid-19: Secondary | ICD-10-CM

## 2019-11-25 LAB — NOVEL CORONAVIRUS, NAA: SARS-CoV-2, NAA: NOT DETECTED

## 2019-11-25 LAB — SARS-COV-2, NAA 2 DAY TAT

## 2019-12-01 ENCOUNTER — Ambulatory Visit: Payer: Medicare PPO | Attending: Internal Medicine

## 2019-12-01 DIAGNOSIS — Z23 Encounter for immunization: Secondary | ICD-10-CM

## 2019-12-01 NOTE — Progress Notes (Signed)
   Covid-19 Vaccination Clinic  Name:  Kikue Gerhart    MRN: [id number] DOB: [birth year]  12/01/2019  Ms. Rampersad was observed post Covid-19 immunization for 15 minutes without incident. She was provided with Vaccine Information Sheet and instruction to access the V-Safe system.   Ms. Pick was instructed to call 911 with any severe reactions post vaccine: Marland Kitchen Difficulty breathing  . Swelling of face and throat  . A fast heartbeat  . A bad rash all over body  . Dizziness and weakness

## 2019-12-23 DIAGNOSIS — I10 Essential (primary) hypertension: Secondary | ICD-10-CM | POA: Diagnosis not present

## 2019-12-23 DIAGNOSIS — E7800 Pure hypercholesterolemia, unspecified: Secondary | ICD-10-CM | POA: Diagnosis not present

## 2019-12-23 DIAGNOSIS — E11319 Type 2 diabetes mellitus with unspecified diabetic retinopathy without macular edema: Secondary | ICD-10-CM | POA: Diagnosis not present

## 2019-12-23 DIAGNOSIS — I872 Venous insufficiency (chronic) (peripheral): Secondary | ICD-10-CM | POA: Diagnosis not present

## 2019-12-23 DIAGNOSIS — Z23 Encounter for immunization: Secondary | ICD-10-CM | POA: Diagnosis not present

## 2020-01-13 DIAGNOSIS — H524 Presbyopia: Secondary | ICD-10-CM | POA: Diagnosis not present

## 2020-01-13 DIAGNOSIS — E119 Type 2 diabetes mellitus without complications: Secondary | ICD-10-CM | POA: Diagnosis not present

## 2020-01-13 DIAGNOSIS — Z961 Presence of intraocular lens: Secondary | ICD-10-CM | POA: Diagnosis not present

## 2020-02-18 DIAGNOSIS — Z03818 Encounter for observation for suspected exposure to other biological agents ruled out: Secondary | ICD-10-CM | POA: Diagnosis not present

## 2020-02-18 DIAGNOSIS — J00 Acute nasopharyngitis [common cold]: Secondary | ICD-10-CM | POA: Diagnosis not present

## 2020-02-18 DIAGNOSIS — Z20822 Contact with and (suspected) exposure to covid-19: Secondary | ICD-10-CM | POA: Diagnosis not present

## 2020-03-23 ENCOUNTER — Other Ambulatory Visit: Payer: Medicare PPO

## 2020-03-23 ENCOUNTER — Other Ambulatory Visit: Payer: Self-pay

## 2020-03-23 DIAGNOSIS — Z20822 Contact with and (suspected) exposure to covid-19: Secondary | ICD-10-CM

## 2020-03-24 LAB — SARS-COV-2, NAA 2 DAY TAT

## 2020-03-24 LAB — NOVEL CORONAVIRUS, NAA: SARS-CoV-2, NAA: NOT DETECTED

## 2020-04-12 ENCOUNTER — Other Ambulatory Visit: Payer: Self-pay

## 2020-04-12 ENCOUNTER — Other Ambulatory Visit: Payer: Medicare PPO

## 2020-04-12 DIAGNOSIS — Z20822 Contact with and (suspected) exposure to covid-19: Secondary | ICD-10-CM

## 2020-04-13 LAB — SARS-COV-2, NAA 2 DAY TAT

## 2020-04-13 LAB — NOVEL CORONAVIRUS, NAA: SARS-CoV-2, NAA: NOT DETECTED

## 2020-05-24 DIAGNOSIS — E11319 Type 2 diabetes mellitus with unspecified diabetic retinopathy without macular edema: Secondary | ICD-10-CM | POA: Diagnosis not present

## 2020-05-24 DIAGNOSIS — Z0000 Encounter for general adult medical examination without abnormal findings: Secondary | ICD-10-CM | POA: Diagnosis not present

## 2020-05-24 DIAGNOSIS — I10 Essential (primary) hypertension: Secondary | ICD-10-CM | POA: Diagnosis not present

## 2020-05-24 DIAGNOSIS — E7800 Pure hypercholesterolemia, unspecified: Secondary | ICD-10-CM | POA: Diagnosis not present

## 2020-05-24 DIAGNOSIS — Z23 Encounter for immunization: Secondary | ICD-10-CM | POA: Diagnosis not present

## 2020-05-24 DIAGNOSIS — M109 Gout, unspecified: Secondary | ICD-10-CM | POA: Diagnosis not present

## 2020-06-08 ENCOUNTER — Ambulatory Visit: Payer: Medicare PPO | Attending: Internal Medicine

## 2020-06-08 DIAGNOSIS — Z20822 Contact with and (suspected) exposure to covid-19: Secondary | ICD-10-CM | POA: Diagnosis not present

## 2020-06-09 LAB — SARS-COV-2, NAA 2 DAY TAT

## 2020-06-09 LAB — NOVEL CORONAVIRUS, NAA: SARS-CoV-2, NAA: NOT DETECTED

## 2020-07-20 ENCOUNTER — Ambulatory Visit: Payer: Medicare PPO | Attending: Internal Medicine

## 2020-07-20 DIAGNOSIS — Z23 Encounter for immunization: Secondary | ICD-10-CM

## 2020-07-20 NOTE — Progress Notes (Signed)
   Covid-19 Vaccination Clinic  Name:  Penny Jackson    MRN: [id number] DOB: [birth year]  07/20/2020  [name] was observed post Covid-19 immunization for 15 minutes without incident. She was provided with Vaccine Information Sheet and instruction to access the V-Safe system.   [name] was instructed to call 911 with any severe reactions post vaccine: Marland Kitchen Difficulty breathing  . Swelling of face and throat  . A fast heartbeat  . A bad rash all over body  . Dizziness and weakness   Immunizations Administered    Name Date Dose VIS Date Route   PFIZER Comrnaty(Gray TOP) Covid-19 Vaccine 07/20/2020 11:28 AM 0.3 mL 02/04/2020 Intramuscular   Manufacturer: [employer], [employer]   Lot: [id number]   NDC: [phone]

## 2020-07-21 ENCOUNTER — Other Ambulatory Visit (HOSPITAL_COMMUNITY): Payer: Self-pay

## 2020-07-21 MED ORDER — COVID-19 MRNA VAC-TRIS(PFIZER) 30 MCG/0.3ML IM SUSP
INTRAMUSCULAR | 0 refills | Status: AC
  Filled 2020-07-21: qty 0.3, 17d supply, fill #0

## 2020-07-22 ENCOUNTER — Other Ambulatory Visit (HOSPITAL_COMMUNITY): Payer: Self-pay

## 2020-09-23 ENCOUNTER — Other Ambulatory Visit (HOSPITAL_COMMUNITY): Payer: Self-pay

## 2020-10-03 DIAGNOSIS — Z20822 Contact with and (suspected) exposure to covid-19: Secondary | ICD-10-CM | POA: Diagnosis not present

## 2020-10-06 DIAGNOSIS — Z03818 Encounter for observation for suspected exposure to other biological agents ruled out: Secondary | ICD-10-CM | POA: Diagnosis not present

## 2020-10-06 DIAGNOSIS — Z20822 Contact with and (suspected) exposure to covid-19: Secondary | ICD-10-CM | POA: Diagnosis not present

## 2020-10-25 ENCOUNTER — Other Ambulatory Visit: Payer: Self-pay | Admitting: Family Medicine

## 2020-10-25 DIAGNOSIS — Z1231 Encounter for screening mammogram for malignant neoplasm of breast: Secondary | ICD-10-CM

## 2020-11-22 DIAGNOSIS — Z23 Encounter for immunization: Secondary | ICD-10-CM | POA: Diagnosis not present

## 2020-11-22 DIAGNOSIS — E538 Deficiency of other specified B group vitamins: Secondary | ICD-10-CM | POA: Diagnosis not present

## 2020-11-22 DIAGNOSIS — I10 Essential (primary) hypertension: Secondary | ICD-10-CM | POA: Diagnosis not present

## 2020-11-22 DIAGNOSIS — E11319 Type 2 diabetes mellitus with unspecified diabetic retinopathy without macular edema: Secondary | ICD-10-CM | POA: Diagnosis not present

## 2020-11-22 DIAGNOSIS — R413 Other amnesia: Secondary | ICD-10-CM | POA: Diagnosis not present

## 2020-11-29 DIAGNOSIS — E538 Deficiency of other specified B group vitamins: Secondary | ICD-10-CM | POA: Diagnosis not present

## 2020-12-02 ENCOUNTER — Ambulatory Visit
Admission: RE | Admit: 2020-12-02 | Discharge: 2020-12-02 | Disposition: A | Discharge status: Home / Self Care | Payer: Medicare PPO | Source: Ambulatory Visit | Attending: Family Medicine | Admitting: Family Medicine

## 2020-12-02 ENCOUNTER — Other Ambulatory Visit: Payer: Self-pay

## 2020-12-02 DIAGNOSIS — Z1231 Encounter for screening mammogram for malignant neoplasm of breast: Secondary | ICD-10-CM

## 2020-12-28 DIAGNOSIS — E538 Deficiency of other specified B group vitamins: Secondary | ICD-10-CM | POA: Diagnosis not present

## 2021-01-11 DIAGNOSIS — E119 Type 2 diabetes mellitus without complications: Secondary | ICD-10-CM | POA: Diagnosis not present

## 2021-01-11 DIAGNOSIS — Z961 Presence of intraocular lens: Secondary | ICD-10-CM | POA: Diagnosis not present

## 2021-01-31 DIAGNOSIS — E538 Deficiency of other specified B group vitamins: Secondary | ICD-10-CM | POA: Diagnosis not present

## 2021-03-02 DIAGNOSIS — E538 Deficiency of other specified B group vitamins: Secondary | ICD-10-CM | POA: Diagnosis not present

## 2021-04-04 DIAGNOSIS — E538 Deficiency of other specified B group vitamins: Secondary | ICD-10-CM | POA: Diagnosis not present

## 2021-05-02 DIAGNOSIS — E538 Deficiency of other specified B group vitamins: Secondary | ICD-10-CM | POA: Diagnosis not present

## 2021-05-22 IMAGING — MG MM BREAST BX W LOC DEV 1ST LESION IMAGE BX SPEC STEREO GUIDE*L*
6 of 16 series · 6 of 40 positions shown · non-contrast
Comparison: Previous exams.
COMPARISON: Previous exams.

Addendum:
CLINICAL DATA: 73-year-old female with a mass with associated
calcifications in the upper inner posterior left breast.

EXAM:
LEFT BREAST STEREOTACTIC CORE NEEDLE BIOPSY

[L (1 of 5)]
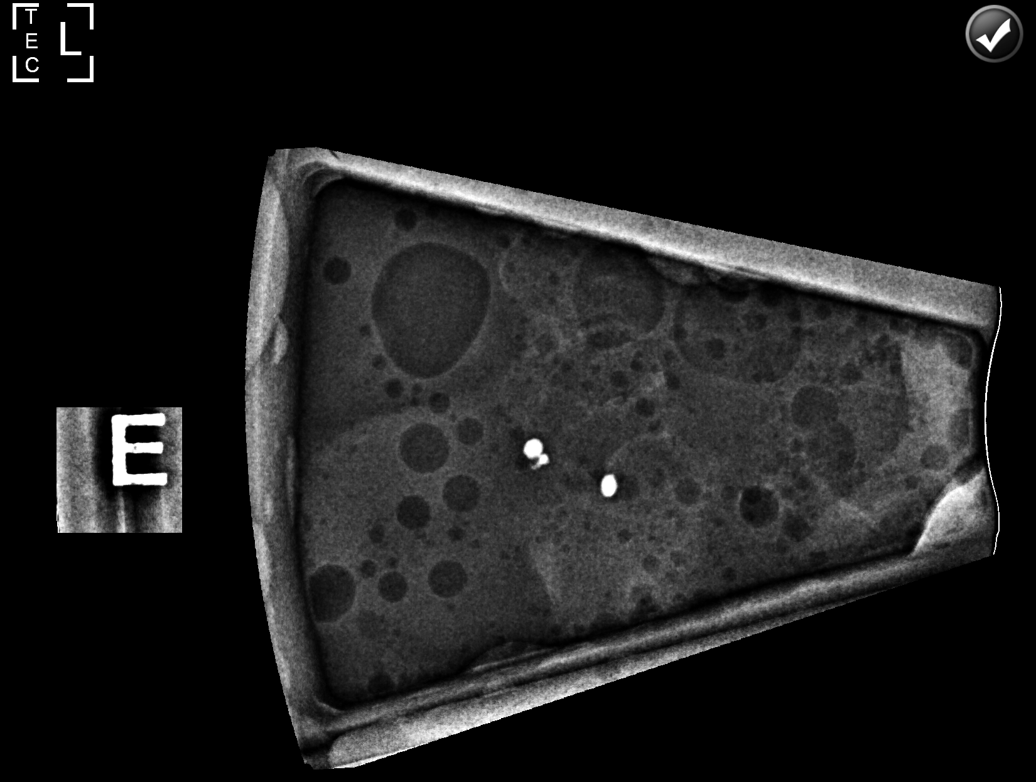

[L (2 of 5)]
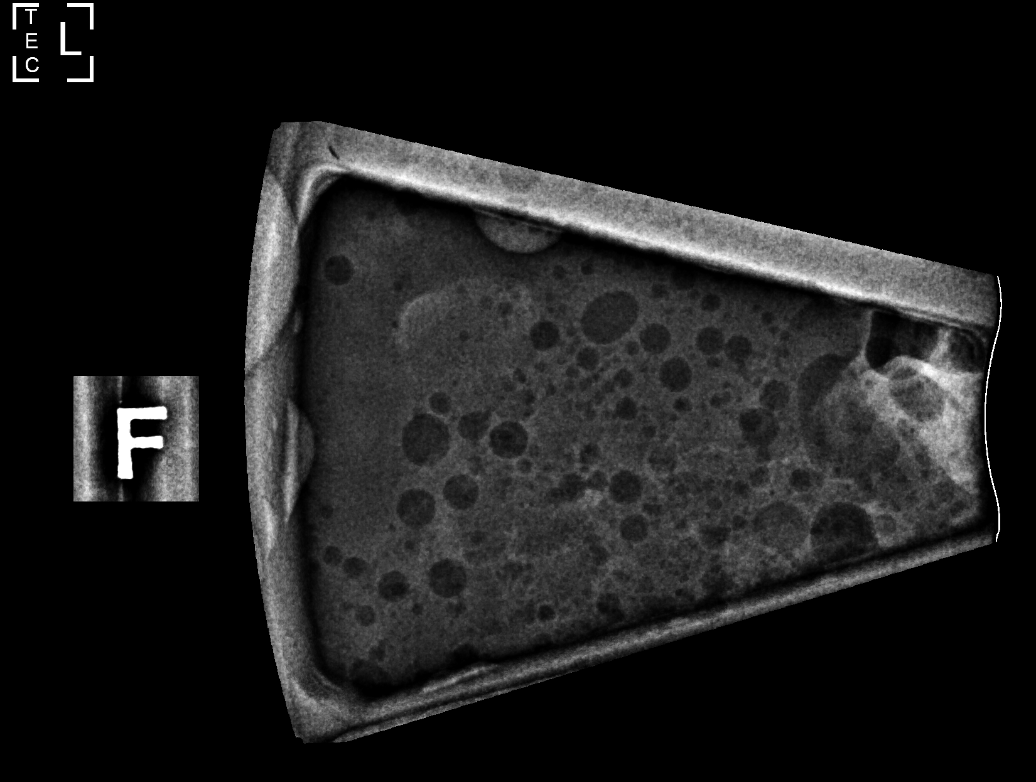

[L (3 of 5)]
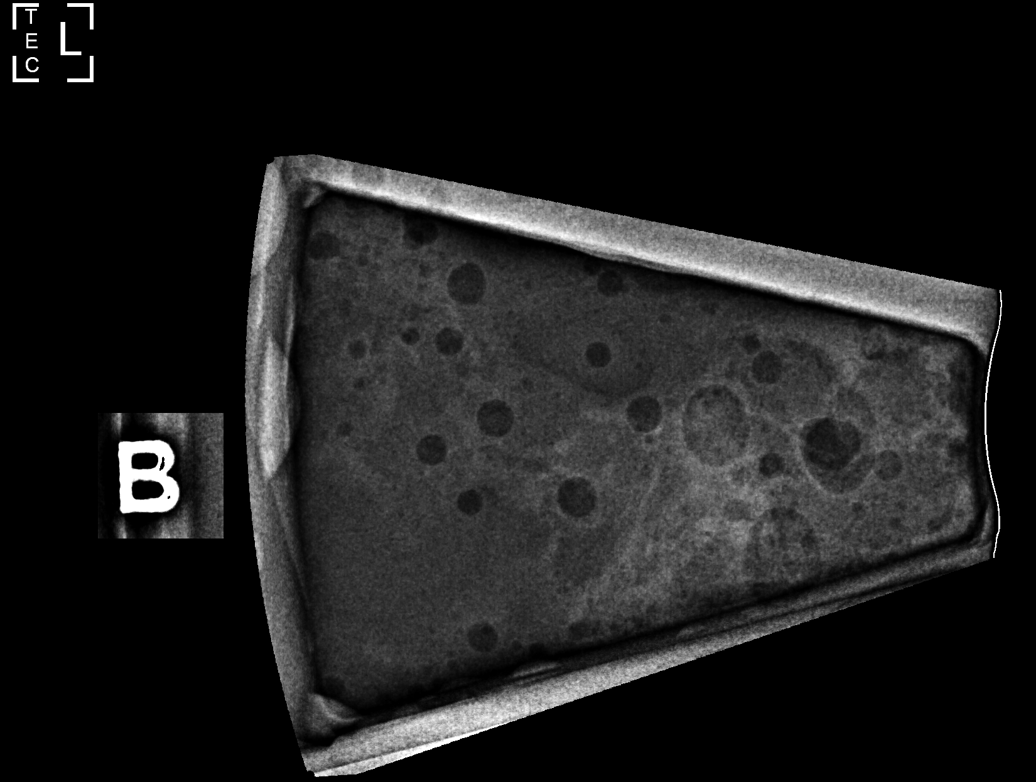

[L (4 of 5)]
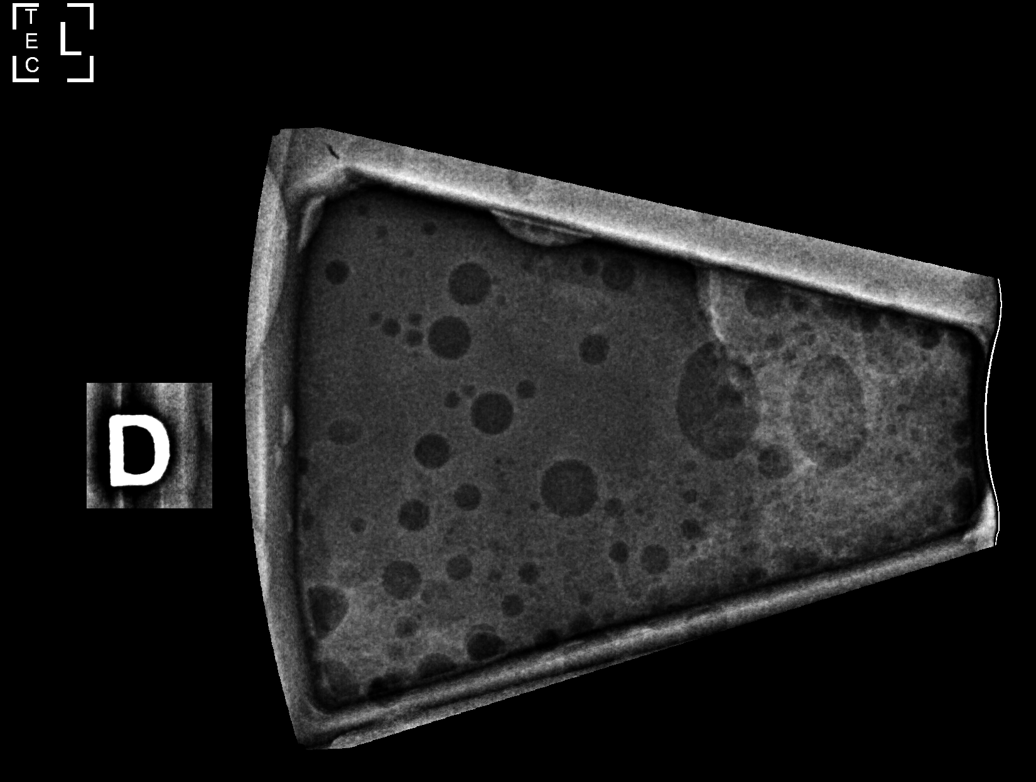

[L (5 of 5)]
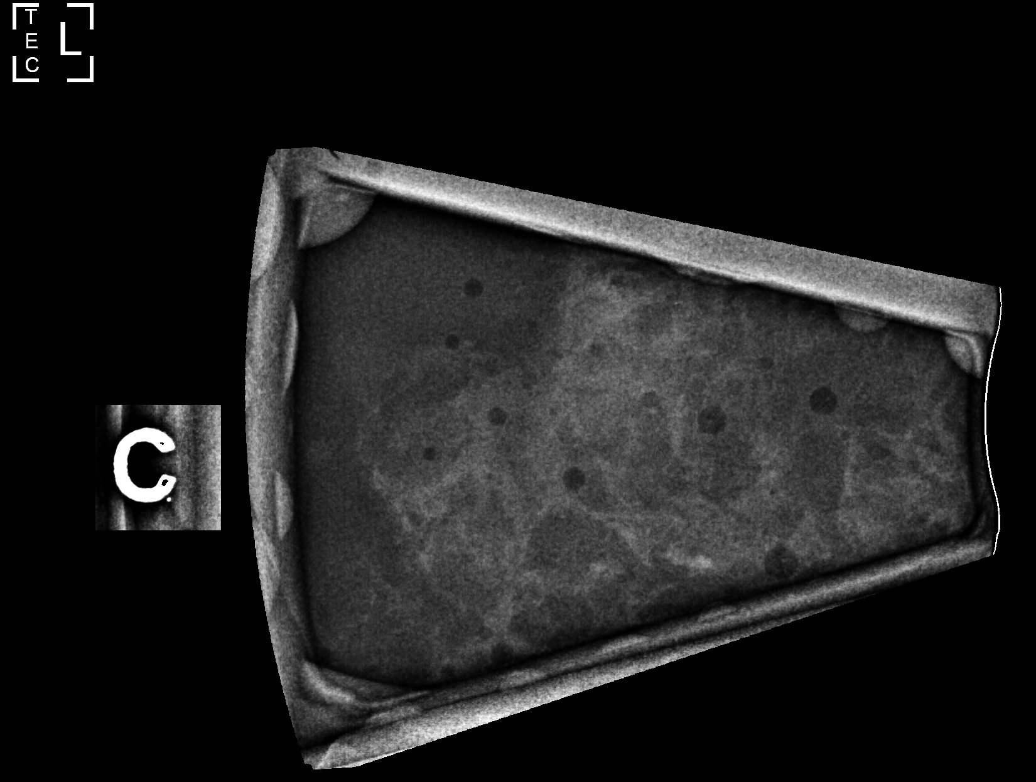

[L CC tomo · tomo slice 35/68.0]
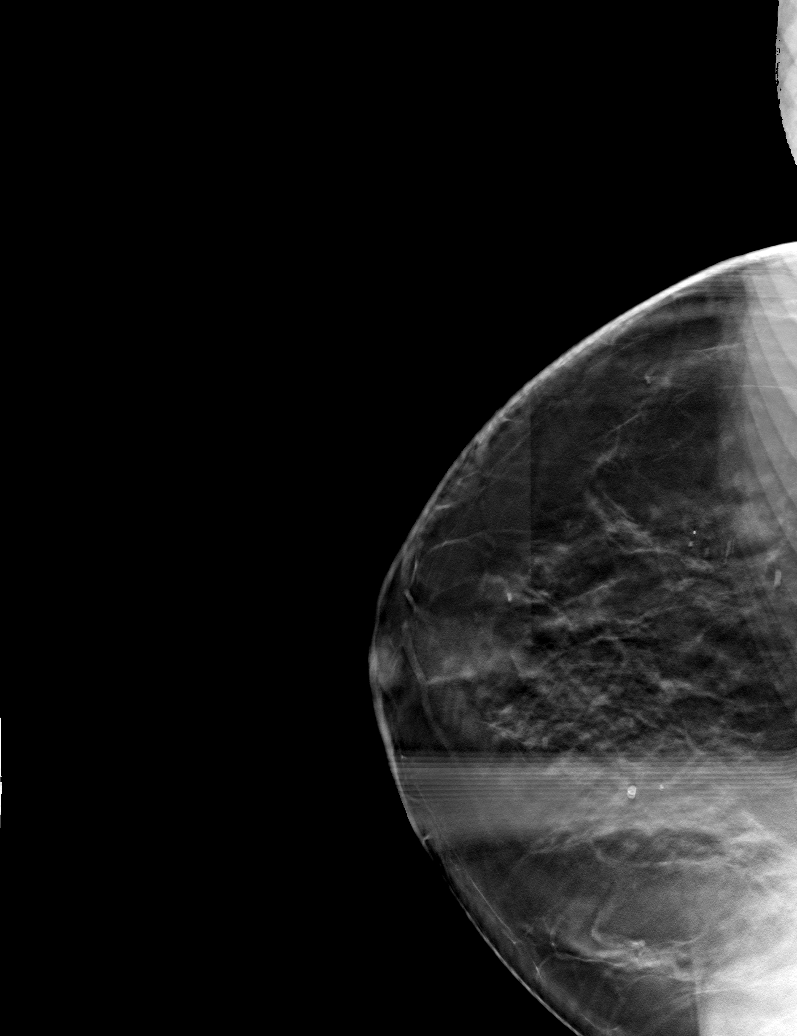

[6 of 40 positions shown; findings below may reference images not displayed]



Using sterile technique and 1% Lidocaine as local anesthetic, under
stereotactic guidance, a 9 gauge vacuum assisted device was used to
perform core needle biopsy of the mass with calcifications in the
upper inner posterior left breast using a superior to inferior
approach. Specimen radiograph was performed showing the presence of
calcifications. Specimens with calcifications are identified for
pathology.

Lesion quadrant: Upper inner

At the conclusion of the procedure, a coil shaped tissue marker clip
was deployed into the biopsy cavity. Follow-up 2-view mammogram was
performed and dictated separately.
IMPRESSION: Stereotactic-guided biopsy of the mass with associated
calcifications in the upper inner posterior left breast. No apparent
complications.

ADDENDUM:
Pathology revealed FIBROCYSTIC CHANGES WITH CALCIFICATIONS of the
Left breast, upper inner. This was found to be concordant by Dr.
Sorin Oxendine.

Pathology results were discussed with the patient by telephone. The
patient reported doing well after the biopsy with tenderness at the
site. Post biopsy instructions and care were reviewed and questions
were answered. The patient was encouraged to call The [REDACTED]

The patient was instructed to return for annual screening
mammography and informed a reminder notice would be sent regarding
this appointment.

Pathology results reported by Toju Maredia, RN on 11/20/2018.



Using sterile technique and 1% Lidocaine as local anesthetic, under
stereotactic guidance, a 9 gauge vacuum assisted device was used to
perform core needle biopsy of the mass with calcifications in the
upper inner posterior left breast using a superior to inferior
approach. Specimen radiograph was performed showing the presence of
calcifications. Specimens with calcifications are identified for
pathology.

Lesion quadrant: Upper inner

At the conclusion of the procedure, a coil shaped tissue marker clip
was deployed into the biopsy cavity. Follow-up 2-view mammogram was
performed and dictated separately.
IMPRESSION: Stereotactic-guided biopsy of the mass with associated
calcifications in the upper inner posterior left breast. No apparent
complications.

## 2021-05-30 DIAGNOSIS — M109 Gout, unspecified: Secondary | ICD-10-CM | POA: Diagnosis not present

## 2021-05-30 DIAGNOSIS — E538 Deficiency of other specified B group vitamins: Secondary | ICD-10-CM | POA: Diagnosis not present

## 2021-05-30 DIAGNOSIS — E11319 Type 2 diabetes mellitus with unspecified diabetic retinopathy without macular edema: Secondary | ICD-10-CM | POA: Diagnosis not present

## 2021-05-30 DIAGNOSIS — Z0000 Encounter for general adult medical examination without abnormal findings: Secondary | ICD-10-CM | POA: Diagnosis not present

## 2021-05-30 DIAGNOSIS — E119 Type 2 diabetes mellitus without complications: Secondary | ICD-10-CM | POA: Diagnosis not present

## 2021-05-30 DIAGNOSIS — I10 Essential (primary) hypertension: Secondary | ICD-10-CM | POA: Diagnosis not present

## 2021-05-30 DIAGNOSIS — L84 Corns and callosities: Secondary | ICD-10-CM | POA: Diagnosis not present

## 2021-05-30 DIAGNOSIS — E7800 Pure hypercholesterolemia, unspecified: Secondary | ICD-10-CM | POA: Diagnosis not present

## 2021-06-06 DIAGNOSIS — E538 Deficiency of other specified B group vitamins: Secondary | ICD-10-CM | POA: Diagnosis not present

## 2021-06-19 ENCOUNTER — Ambulatory Visit: Payer: Medicare PPO | Admitting: Podiatry

## 2021-06-19 DIAGNOSIS — I872 Venous insufficiency (chronic) (peripheral): Secondary | ICD-10-CM | POA: Diagnosis not present

## 2021-06-19 NOTE — Progress Notes (Signed)
? ?  HPI: 76 y.o. female presenting today for new complaint regarding discoloration with swelling to the bilateral lower extremities.  Patient states that she was seen recently by vascular doctor who diagnosed her with venous insufficiency and recommended compression hose.  She also has a symptomatic callus to the left foot that she would like addressed.  She does have a past surgical history of first MTP arthroplasty with implant right foot.  DOS: 05/21/2019 ? ?Past Medical History:  ?Diagnosis Date  ? Diabetes mellitus   ? GERD (gastroesophageal reflux disease)   ? Hypertension   ? Wears glasses   ? ? ?Past Surgical History:  ?Procedure Laterality Date  ? ABDOMINAL HYSTERECTOMY  1997  ? BREAST BIOPSY Left 10/2018  ? DORSAL COMPARTMENT RELEASE  03/13/2011  ? Procedure: RELEASE DORSAL COMPARTMENT (DEQUERVAIN);  Surgeon: Cammie Sickle., MD;  Location: [facility];  Service: Orthopedics;  Laterality: Right;  release 1st dorsal compartment on right  ? TRIGGER FINGER RELEASE Left 09/09/2012  ? Procedure: RELEASE TRIGGER FINGER/A-1 PULLEY LEFT THUMB FIRST DORSAL COMPARTMENT RELEASE LEFT;  Surgeon: Cammie Sickle., MD;  Location: [place] [facility];  Service: Orthopedics;  Laterality: Left;  ? ? ?No Known Allergies ?  ?Physical Exam: ?General: The patient is alert and oriented x3 in no acute distress. ? ?Dermatology: Skin is warm, dry and supple bilateral lower extremities. Negative for open lesions or macerations.  No open wounds.  There is some hyperkeratotic callus tissue noted to the medial border of the left great toe as well as the plantar aspect of the forefoot ? ?Vascular: Chronic venous insufficiency with hemosiderin discoloration lower extremities beginning just proximal to the ankles. ? ?Neurological: Light touch and protective threshold grossly intact ? ?Musculoskeletal Exam: No pedal deformities noted ? ?Assessment: ?1.  Chronic venous peripheral insufficiency ?2.  Symptomatic callus  left great toe and left forefoot ?3. PSxHx first MTP arthroplasty with implant RT. 05/21/2019 ? ? ?Plan of Care:  ?1. Patient evaluated.  ?2.  Excisional debridement of the hyperkeratotic callus tissue was performed to the left foot as a courtesy for the patient using a 312 scalpel. ?3.  Today we discussed the etiology and pathology of venous insufficiency.  I do agree with the vascular surgeon to recommend wearing compression hose daily.  Patient has a pair of thigh-high compression hose.  Wear daily ?4.  Return to clinic annually ?  ?  ?Edrick Kins, DPM ?[facility] ? ?Dr. Edrick Kins, DPM  ?  ?[address]                                        ?[place], [place] [address]                ?Office [phone]  ?Fax [phone] ? ? ? ? ?

## 2021-06-22 ENCOUNTER — Telehealth: Payer: Self-pay | Admitting: Podiatry

## 2021-06-22 NOTE — Telephone Encounter (Signed)
Pt is having a lot of pain in her Right foot - she states her muscle from her hip down to her ankle has her in unbearable pain. She requested to speak to Dr. Amalia Hailey ? ?Please advise. ?

## 2021-06-27 NOTE — Telephone Encounter (Signed)
Spoke with patient. Feeling much better. F/u in office PRN. - Dr. Logan Bores

## 2021-12-19 DIAGNOSIS — Z6827 Body mass index (BMI) 27.0-27.9, adult: Secondary | ICD-10-CM | POA: Diagnosis not present

## 2021-12-19 DIAGNOSIS — Z23 Encounter for immunization: Secondary | ICD-10-CM | POA: Diagnosis not present

## 2021-12-19 DIAGNOSIS — E11319 Type 2 diabetes mellitus with unspecified diabetic retinopathy without macular edema: Secondary | ICD-10-CM | POA: Diagnosis not present

## 2021-12-19 DIAGNOSIS — I10 Essential (primary) hypertension: Secondary | ICD-10-CM | POA: Diagnosis not present

## 2022-01-15 DIAGNOSIS — H52203 Unspecified astigmatism, bilateral: Secondary | ICD-10-CM | POA: Diagnosis not present

## 2022-01-15 DIAGNOSIS — Z961 Presence of intraocular lens: Secondary | ICD-10-CM | POA: Diagnosis not present

## 2022-01-15 DIAGNOSIS — E119 Type 2 diabetes mellitus without complications: Secondary | ICD-10-CM | POA: Diagnosis not present

## 2022-01-15 DIAGNOSIS — H524 Presbyopia: Secondary | ICD-10-CM | POA: Diagnosis not present

## 2022-01-15 DIAGNOSIS — H5203 Hypermetropia, bilateral: Secondary | ICD-10-CM | POA: Diagnosis not present

## 2022-04-11 DIAGNOSIS — Z86010 Personal history of colonic polyps: Secondary | ICD-10-CM | POA: Diagnosis not present

## 2022-04-11 DIAGNOSIS — E739 Lactose intolerance, unspecified: Secondary | ICD-10-CM | POA: Diagnosis not present

## 2022-04-11 DIAGNOSIS — R195 Other fecal abnormalities: Secondary | ICD-10-CM | POA: Diagnosis not present

## 2022-04-30 DIAGNOSIS — H66002 Acute suppurative otitis media without spontaneous rupture of ear drum, left ear: Secondary | ICD-10-CM | POA: Diagnosis not present

## 2022-04-30 DIAGNOSIS — R059 Cough, unspecified: Secondary | ICD-10-CM | POA: Diagnosis not present

## 2022-04-30 DIAGNOSIS — I10 Essential (primary) hypertension: Secondary | ICD-10-CM | POA: Diagnosis not present

## 2022-04-30 DIAGNOSIS — Z6831 Body mass index (BMI) 31.0-31.9, adult: Secondary | ICD-10-CM | POA: Diagnosis not present

## 2022-05-04 DIAGNOSIS — I10 Essential (primary) hypertension: Secondary | ICD-10-CM | POA: Diagnosis not present

## 2022-05-04 DIAGNOSIS — Z6827 Body mass index (BMI) 27.0-27.9, adult: Secondary | ICD-10-CM | POA: Diagnosis not present

## 2022-05-04 DIAGNOSIS — R197 Diarrhea, unspecified: Secondary | ICD-10-CM | POA: Diagnosis not present

## 2022-05-04 DIAGNOSIS — E11319 Type 2 diabetes mellitus with unspecified diabetic retinopathy without macular edema: Secondary | ICD-10-CM | POA: Diagnosis not present

## 2022-05-04 DIAGNOSIS — R0989 Other specified symptoms and signs involving the circulatory and respiratory systems: Secondary | ICD-10-CM | POA: Diagnosis not present

## 2022-05-08 DIAGNOSIS — D123 Benign neoplasm of transverse colon: Secondary | ICD-10-CM | POA: Diagnosis not present

## 2022-05-08 DIAGNOSIS — Z09 Encounter for follow-up examination after completed treatment for conditions other than malignant neoplasm: Secondary | ICD-10-CM | POA: Diagnosis not present

## 2022-05-08 DIAGNOSIS — D120 Benign neoplasm of cecum: Secondary | ICD-10-CM | POA: Diagnosis not present

## 2022-05-08 DIAGNOSIS — K648 Other hemorrhoids: Secondary | ICD-10-CM | POA: Diagnosis not present

## 2022-05-08 DIAGNOSIS — D128 Benign neoplasm of rectum: Secondary | ICD-10-CM | POA: Diagnosis not present

## 2022-05-08 DIAGNOSIS — Z86010 Personal history of colonic polyps: Secondary | ICD-10-CM | POA: Diagnosis not present

## 2022-05-10 DIAGNOSIS — D120 Benign neoplasm of cecum: Secondary | ICD-10-CM | POA: Diagnosis not present

## 2022-05-10 DIAGNOSIS — D128 Benign neoplasm of rectum: Secondary | ICD-10-CM | POA: Diagnosis not present

## 2022-05-26 DIAGNOSIS — R030 Elevated blood-pressure reading, without diagnosis of hypertension: Secondary | ICD-10-CM | POA: Diagnosis not present

## 2022-05-26 DIAGNOSIS — Z20822 Contact with and (suspected) exposure to covid-19: Secondary | ICD-10-CM | POA: Diagnosis not present

## 2022-05-26 DIAGNOSIS — Z6828 Body mass index (BMI) 28.0-28.9, adult: Secondary | ICD-10-CM | POA: Diagnosis not present

## 2022-05-26 DIAGNOSIS — Z789 Other specified health status: Secondary | ICD-10-CM | POA: Diagnosis not present

## 2022-07-16 DIAGNOSIS — M109 Gout, unspecified: Secondary | ICD-10-CM | POA: Diagnosis not present

## 2022-07-16 DIAGNOSIS — R0989 Other specified symptoms and signs involving the circulatory and respiratory systems: Secondary | ICD-10-CM | POA: Diagnosis not present

## 2022-07-16 DIAGNOSIS — E538 Deficiency of other specified B group vitamins: Secondary | ICD-10-CM | POA: Diagnosis not present

## 2022-07-16 DIAGNOSIS — E7800 Pure hypercholesterolemia, unspecified: Secondary | ICD-10-CM | POA: Diagnosis not present

## 2022-07-16 DIAGNOSIS — I10 Essential (primary) hypertension: Secondary | ICD-10-CM | POA: Diagnosis not present

## 2022-07-16 DIAGNOSIS — Z6827 Body mass index (BMI) 27.0-27.9, adult: Secondary | ICD-10-CM | POA: Diagnosis not present

## 2022-07-16 DIAGNOSIS — E11319 Type 2 diabetes mellitus with unspecified diabetic retinopathy without macular edema: Secondary | ICD-10-CM | POA: Diagnosis not present

## 2023-01-09 ENCOUNTER — Encounter: Payer: Self-pay | Admitting: Podiatry

## 2023-01-09 ENCOUNTER — Ambulatory Visit: Payer: Medicare PPO | Admitting: Podiatry

## 2023-01-09 DIAGNOSIS — E119 Type 2 diabetes mellitus without complications: Secondary | ICD-10-CM | POA: Diagnosis not present

## 2023-01-09 DIAGNOSIS — I872 Venous insufficiency (chronic) (peripheral): Secondary | ICD-10-CM | POA: Diagnosis not present

## 2023-01-09 NOTE — Progress Notes (Signed)
   HPI: 77 y.o. female presenting today for evaluation of edema with leg swelling to left lower extremity.  She is concerned for possible development of an ulcer to the anterior aspect of the ankle.  She has noticed some drainage coming from the area recently over the past week or two.  She does not wear the compression hose that were recommended by the vascular doctor.   She does have a past surgical history of first MTP arthroplasty with implant right foot.  DOS: 05/21/2019  Past Medical History:  Diagnosis Date   Diabetes mellitus    GERD (gastroesophageal reflux disease)    Hypertension    Wears glasses     Past Surgical History:  Procedure Laterality Date   ABDOMINAL HYSTERECTOMY  1997   BREAST BIOPSY Left 10/2018   DORSAL COMPARTMENT RELEASE  03/13/2011   Procedure: RELEASE DORSAL COMPARTMENT (DEQUERVAIN);  Surgeon: Wyn Forster., MD;  Location: [facility];  Service: Orthopedics;  Laterality: Right;  release 1st dorsal compartment on right   TRIGGER FINGER RELEASE Left 09/09/2012   Procedure: RELEASE TRIGGER FINGER/A-1 PULLEY LEFT THUMB FIRST DORSAL COMPARTMENT RELEASE LEFT;  Surgeon: Wyn Forster., MD;  Location: [facility];  Service: Orthopedics;  Laterality: Left;    No Known Allergies    LT leg 01/09/2023  Physical Exam: General: The patient is alert and oriented x3 in no acute distress.  Dermatology: Skin is warm, dry and supple bilateral lower extremities.  Preulcerative superficial breakdown of the anterior aspect of the ankle noted left lower extremity concerning for the beginnings of venous ulcer.  Please see above noted photo  Vascular: Chronic venous insufficiency with hemosiderin discoloration lower extremities beginning just proximal to the ankles.  Neurological: Light touch and protective threshold grossly intact  Musculoskeletal Exam: No pedal deformities noted  Assessment: 1.  Chronic venous peripheral  insufficiency 2.  Symptomatic callus left great toe and left forefoot 3. PSxHx first MTP arthroplasty with implant RT. 05/21/2019   Plan of Care:  -Patient evaluated.  Comprehensive diabetic foot exam performed today -Patient states that she has not worn her compression socks for several months.  She has slowly noticed increased edema with swelling as well as seeping from the anterior aspect of the ankle -To address the chronic venous insufficiency with edema I did apply multilayer Unna boot compression to the left lower extremity.  Leave clean dry and intact x 1 week.  After that she may remove the dressing and stressed the importance of wearing compression socks to prevent edema and skin breakdown -Return to clinic 2 weeks     Felecia Shelling, DPM [facility]  Dr. Felecia Shelling, DPM    [address] [place], [place] [address]                Office [phone]  Fax [phone]

## 2023-01-17 ENCOUNTER — Telehealth: Payer: Self-pay | Admitting: Podiatry

## 2023-01-17 ENCOUNTER — Encounter: Payer: Self-pay | Admitting: Podiatry

## 2023-01-17 ENCOUNTER — Ambulatory Visit: Payer: Medicare PPO | Admitting: Podiatry

## 2023-01-17 DIAGNOSIS — I872 Venous insufficiency (chronic) (peripheral): Secondary | ICD-10-CM | POA: Diagnosis not present

## 2023-01-17 DIAGNOSIS — L97321 Non-pressure chronic ulcer of left ankle limited to breakdown of skin: Secondary | ICD-10-CM | POA: Diagnosis not present

## 2023-01-17 MED ORDER — CEPHALEXIN 500 MG PO CAPS: 500.0000 mg | ORAL_CAPSULE | Freq: Three times a day (TID) | ORAL | 0 refills | Status: DC

## 2023-01-17 NOTE — Telephone Encounter (Signed)
Patient is requesting you call her sometime today in regards to a procedure you did on her foot, it has left her "bloody and runny" she is not happy   Thank you

## 2023-01-17 NOTE — Progress Notes (Signed)
Subjective: Chief Complaint  Patient presents with   Foot Ulcer    RM#11 diabetic pt called wraps were taken off yesterday from ulcers and was in the shower and there was black stuff coming off./ hx of ulcer burst open seeping puss and blood( discussed with Irma)   77 year old female presents the office today for an acute appointment.  She states he took the wraps off yesterday and quite a bit of dark skin came off and she had drainage, purulence coming from the wound.  She has been using Neosporin as well as steroid cream along the area of the leg that itches.  She does not report any fevers or chills.  Objective: AAO x3, NAD DP/PT pulses palpable bilaterally, CRT less than 3 seconds Superficial area skin present anterior to the ankle but not able to identify any areas of drainage or purulence today.  There is no areas of fluctuation or crepitation.  There is very minimal warmth to the area.  The calf is supple.  There is chronic venous changes present. No pain with calf compression, swelling, warmth, erythema       Assessment: Superficial wound left ankle, venous insufficiency  Plan: -All treatment options discussed with the patient including all alternatives, risks, complications.  -The patient was already removed.  Cleaned the area.  Given the history she is explaining both start Keflex which I prescribed today.  Continue compression socks.  Did not reapply the Unna boot today but she needs to continue elevation, and continue compression. -Refer to [place] vein specialist -She can continue the steroid cream on the leg with an open lesion. -Patient encouraged to call the office with any questions, concerns, change in symptoms.    Vivi Barrack DPM

## 2023-01-21 ENCOUNTER — Telehealth: Payer: Self-pay

## 2023-01-21 NOTE — Telephone Encounter (Signed)
Referral, office note and demographics faxed to [facility] [phone] [phone]

## 2023-01-21 NOTE — Telephone Encounter (Signed)
-----   Message from Vivi Barrack sent at 01/20/2023  8:44 AM EST ----- Can you please fax a referral to [facility] for her? I put it in as "internal medicine". Thanks!

## 2023-01-22 DIAGNOSIS — I890 Lymphedema, not elsewhere classified: Secondary | ICD-10-CM | POA: Diagnosis not present

## 2023-01-22 DIAGNOSIS — I83892 Varicose veins of left lower extremities with other complications: Secondary | ICD-10-CM | POA: Diagnosis not present

## 2023-01-22 DIAGNOSIS — L97329 Non-pressure chronic ulcer of left ankle with unspecified severity: Secondary | ICD-10-CM | POA: Diagnosis not present

## 2023-01-22 DIAGNOSIS — I83893 Varicose veins of bilateral lower extremities with other complications: Secondary | ICD-10-CM | POA: Diagnosis not present

## 2023-01-22 DIAGNOSIS — I83023 Varicose veins of left lower extremity with ulcer of ankle: Secondary | ICD-10-CM | POA: Diagnosis not present

## 2023-01-30 ENCOUNTER — Ambulatory Visit: Payer: Medicare PPO | Admitting: Podiatry

## 2023-01-30 DIAGNOSIS — I872 Venous insufficiency (chronic) (peripheral): Secondary | ICD-10-CM

## 2023-01-30 NOTE — Progress Notes (Signed)
   No chief complaint on file.   HPI: 77 y.o. female presenting today for follow-up evaluation of edema with swelling and venous insufficiency specifically to the left lower extremity.  Patient has an appointment tomorrow to be set up with lymphedema pumps.  She also has an appointment on 02/04/2023 with her vascular specialist for venous ablation procedures.  Overall there has been some improvement since last visit  Past Medical History:  Diagnosis Date   Diabetes mellitus    GERD (gastroesophageal reflux disease)    Hypertension    Wears glasses     Past Surgical History:  Procedure Laterality Date   ABDOMINAL HYSTERECTOMY  1997   BREAST BIOPSY Left 10/2018   DORSAL COMPARTMENT RELEASE  03/13/2011   Procedure: RELEASE DORSAL COMPARTMENT (DEQUERVAIN);  Surgeon: Wyn Forster., MD;  Location: [facility];  Service: Orthopedics;  Laterality: Right;  release 1st dorsal compartment on right   TRIGGER FINGER RELEASE Left 09/09/2012   Procedure: RELEASE TRIGGER FINGER/A-1 PULLEY LEFT THUMB FIRST DORSAL COMPARTMENT RELEASE LEFT;  Surgeon: Wyn Forster., MD;  Location: [facility];  Service: Orthopedics;  Laterality: Left;    No Known Allergies    LT leg 01/09/2023  Physical Exam: General: The patient is alert and oriented x3 in no acute distress.  Dermatology: Skin is warm, dry and supple bilateral lower extremities.  Preulcerative superficial breakdown of the anterior aspect of the ankle noted left lower extremity concerning for the beginnings of venous ulcer.  Please see above noted photo  Vascular: Chronic venous insufficiency with hemosiderin discoloration lower extremities beginning just proximal to the ankles.  Neurological: Light touch and protective threshold grossly intact  Musculoskeletal Exam: No pedal deformities noted  Assessment: 1.  Chronic venous peripheral insufficiency 2.  Symptomatic callus left great toe and left forefoot 3.  PSxHx first MTP arthroplasty with implant RT. 05/21/2019   Plan of Care:  -Patient evaluated.   -Patient has an appointment tomorrow for lymphedema pumps.  Also appointment on 02/04/2023 with her vascular specialist, Dr. Guss Bunde, for venous ablation procedure. -Currently there is nothing additional I can offer for the patient.  Continue close management with vascular -Return to clinic as needed   Felecia Shelling, DPM [facility]  Dr. Felecia Shelling, DPM    [address] [place], [place] [address]                Office [phone]  Fax [phone]

## 2023-02-04 DIAGNOSIS — I10 Essential (primary) hypertension: Secondary | ICD-10-CM | POA: Diagnosis not present

## 2023-02-04 DIAGNOSIS — E7800 Pure hypercholesterolemia, unspecified: Secondary | ICD-10-CM | POA: Diagnosis not present

## 2023-02-04 DIAGNOSIS — Z23 Encounter for immunization: Secondary | ICD-10-CM | POA: Diagnosis not present

## 2023-02-04 DIAGNOSIS — E11319 Type 2 diabetes mellitus with unspecified diabetic retinopathy without macular edema: Secondary | ICD-10-CM | POA: Diagnosis not present

## 2023-02-04 DIAGNOSIS — E538 Deficiency of other specified B group vitamins: Secondary | ICD-10-CM | POA: Diagnosis not present

## 2023-02-04 DIAGNOSIS — Z0000 Encounter for general adult medical examination without abnormal findings: Secondary | ICD-10-CM | POA: Diagnosis not present

## 2023-02-04 DIAGNOSIS — Z6828 Body mass index (BMI) 28.0-28.9, adult: Secondary | ICD-10-CM | POA: Diagnosis not present

## 2023-02-04 DIAGNOSIS — M109 Gout, unspecified: Secondary | ICD-10-CM | POA: Diagnosis not present

## 2023-02-14 DIAGNOSIS — I83892 Varicose veins of left lower extremities with other complications: Secondary | ICD-10-CM | POA: Diagnosis not present

## 2023-02-14 DIAGNOSIS — I10 Essential (primary) hypertension: Secondary | ICD-10-CM | POA: Diagnosis not present

## 2023-02-14 DIAGNOSIS — E7800 Pure hypercholesterolemia, unspecified: Secondary | ICD-10-CM | POA: Diagnosis not present

## 2023-02-14 DIAGNOSIS — E538 Deficiency of other specified B group vitamins: Secondary | ICD-10-CM | POA: Diagnosis not present

## 2023-02-14 DIAGNOSIS — E11319 Type 2 diabetes mellitus with unspecified diabetic retinopathy without macular edema: Secondary | ICD-10-CM | POA: Diagnosis not present

## 2023-02-14 DIAGNOSIS — M109 Gout, unspecified: Secondary | ICD-10-CM | POA: Diagnosis not present

## 2023-02-18 DIAGNOSIS — I890 Lymphedema, not elsewhere classified: Secondary | ICD-10-CM | POA: Diagnosis not present

## 2023-08-05 DIAGNOSIS — E11319 Type 2 diabetes mellitus with unspecified diabetic retinopathy without macular edema: Secondary | ICD-10-CM | POA: Diagnosis not present

## 2023-08-05 DIAGNOSIS — E538 Deficiency of other specified B group vitamins: Secondary | ICD-10-CM | POA: Diagnosis not present

## 2023-08-05 DIAGNOSIS — I10 Essential (primary) hypertension: Secondary | ICD-10-CM | POA: Diagnosis not present

## 2023-08-05 DIAGNOSIS — R413 Other amnesia: Secondary | ICD-10-CM | POA: Diagnosis not present

## 2023-08-05 DIAGNOSIS — Z6827 Body mass index (BMI) 27.0-27.9, adult: Secondary | ICD-10-CM | POA: Diagnosis not present

## 2023-08-05 DIAGNOSIS — L299 Pruritus, unspecified: Secondary | ICD-10-CM | POA: Diagnosis not present

## 2023-08-20 ENCOUNTER — Encounter: Payer: Self-pay | Admitting: Neurology

## 2023-08-20 ENCOUNTER — Ambulatory Visit: Admitting: Neurology

## 2023-08-20 VITALS — BP 130/65 | HR 78 | Ht 66.5 in | Wt 169.5 lb

## 2023-08-20 DIAGNOSIS — G3184 Mild cognitive impairment, so stated: Secondary | ICD-10-CM

## 2023-08-20 DIAGNOSIS — R413 Other amnesia: Secondary | ICD-10-CM | POA: Diagnosis not present

## 2023-08-20 NOTE — Progress Notes (Signed)
 [facility]  PATIENT: Penny Jackson DOB: October 21, 1945  REFERRING DOCTOR OR PCP:  Carlin Gull, MD SOURCE:   _________________________________   HISTORICAL  CHIEF COMPLAINT:  Chief Complaint  Patient presents with   New Patient (Initial Visit)    Pt in room 10.alone. Internal referral for progressive memory loss, new upper extremity bilateral spasms, gait instability. Pt said she does feel her gait off due left leg brace, pt sees vein specialist he recommend brace. Pt reports she has poor blood flow in left leg. Pt reports her hearing is bad, in process in ordering new hearing aids. Pt reports he memory is normal for her age. MOCA:25    HISTORY OF PRESENT ILLNESS:  I had the pleasure of seeing your patient, Penny Jackson, at [facility] for neurologic consultation regarding her memory.  She is an 78 year old woman who reports Dr. Gull was concerned about her memory a few weeks ago and returned to see us .   She notes that her hearing is poor and feels her memory issues are related to this issue.     She is in the process of getting new hearing aids.   She notes that she will occasionally be forgetful so writes things down.  She is driving.  She has not had difficulty getting lost.  She does shopping and other.  According to her recent PCP notes, she was forgetful at her last visit with him.    Recent bloodwork shows B12 was mildly low at 159.  Of note in the past she had B12 shots.   HgbA1c is elevated at 6.9.   No recent brain imaging.  She has HTN, NIDDM and hyperlipidemia.   She also has left leg lymphedema.    She denies any FH of memory issues.   Her mom and sister had thyroid  issues.          08/20/2023    3:17 PM  Montreal Cognitive Assessment   Visuospatial/ Executive (0/5) 4  Naming (0/3) 3  Attention: Read list of digits (0/2) 2  Attention: Read list of letters (0/1) 1  Attention: Serial 7 subtraction starting at 100 (0/3) 2   Language: Repeat phrase (0/2) 2  Language : Fluency (0/1) 1  Abstraction (0/2) 2  Delayed Recall (0/5) 2  Orientation (0/6) 6  Total 25   I retested her about 10 to 15 minutes after the initial MoCA.  Recall was 3/5 at 20 minutes without prompt and 4/5 with prompt.   REVIEW OF SYSTEMS: Constitutional: No fevers, chills, sweats, or change in appetite Eyes: No visual changes, double vision, eye pain Ear, nose and throat: No hearing loss, ear pain, nasal congestion, sore throat Cardiovascular: No chest pain, palpitations Respiratory:  No shortness of breath at rest or with exertion.   No wheezes GastrointestinaI: No nausea, vomiting, diarrhea, abdominal pain, fecal incontinence Genitourinary:  No dysuria, urinary retention or frequency.  No nocturia. Musculoskeletal:  No neck pain, back pain Integumentary: No rash, pruritus, skin lesions Neurological: as above Psychiatric: No depression at this time.  No anxiety Endocrine: No palpitations, diaphoresis, change in appetite, change in weigh or increased thirst Hematologic/Lymphatic:  No anemia, purpura, petechiae. Allergic/Immunologic: No itchy/runny eyes, nasal congestion, recent allergic reactions, rashes  ALLERGIES: No Known Allergies  HOME MEDICATIONS:  Current Outpatient Medications:    amLODipine (NORVASC) 5 MG tablet, , Disp: , Rfl:    metFORMIN (GLUCOPHAGE-XR) 500 MG 24 hr tablet, , Disp: , Rfl:  pravastatin (PRAVACHOL) 20 MG tablet, , Disp: , Rfl:    valsartan-hydrochlorothiazide (DIOVAN-HCT) 320-25 MG per tablet, Take 1 tablet by mouth daily., Disp: , Rfl:    COVID-19 mRNA Vac-TriS, Pfizer, SUSP injection, Inject into the muscle. (Patient not taking: Reported on 80/24/2025), Disp: 0.3 mL, Rfl: 0   glipiZIDE (GLUCOTROL XL) 5 MG 24 hr tablet, Take 5 mg by mouth daily., Disp: , Rfl:    metFORMIN (GLUMETZA) 500 MG (MOD) 24 hr tablet, Take 500 mg by mouth daily with breakfast. (Patient not taking: Reported on 80/24/2025), Disp:  , Rfl:    valsartan (DIOVAN) 320 MG tablet, , Disp: , Rfl:   PAST MEDICAL HISTORY: Past Medical History:  Diagnosis Date   Diabetes mellitus    GERD (gastroesophageal reflux disease)    Hypertension    Wears glasses     PAST SURGICAL HISTORY: Past Surgical History:  Procedure Laterality Date   ABDOMINAL HYSTERECTOMY  1997   BREAST BIOPSY Left 10/2018   DORSAL COMPARTMENT RELEASE  03/13/2011   Procedure: RELEASE DORSAL COMPARTMENT (DEQUERVAIN);  Surgeon: Lamar LULLA Leonor Mickey., MD;  Location: [facility];  Service: Orthopedics;  Laterality: Right;  release 1st dorsal compartment on right   TRIGGER FINGER RELEASE Left 09/09/2012   Procedure: RELEASE TRIGGER FINGER/A-1 PULLEY LEFT THUMB FIRST DORSAL COMPARTMENT RELEASE LEFT;  Surgeon: Lamar LULLA Leonor Mickey., MD;  Location: [facility];  Service: Orthopedics;  Laterality: Left;    FAMILY HISTORY: Family History  Problem Relation Age of Onset   Heart disease Mother    Diabetes Mother    Heart disease Father     SOCIAL HISTORY: Social History   Socioeconomic History   Marital status: Married    Spouse name: Not on file   Number of children: Not on file   Years of education: Not on file   Highest education level: Not on file  Occupational History   Not on file  Tobacco Use   Smoking status: Former    Current packs/day: 0.00    Types: Cigarettes    Quit date: 03/08/1985    Years since quitting: 38.4   Smokeless tobacco: Not on file  Vaping Use   Vaping status: Never Used  Substance and Sexual Activity   Alcohol use: No   Drug use: No   Sexual activity: Not on file  Other Topics Concern   Not on file  Social History Narrative   Not on file   Social Drivers of Health   Financial Resource Strain: Not on file  Food Insecurity: Not on file  Transportation Needs: Not on file  Physical Activity: Not on file  Stress: Not on file  Social Connections: Not on file  Intimate Partner Violence: Not on  file       PHYSICAL EXAM  Vitals:   08/20/23 1506  BP: 130/65  Pulse: 78  SpO2: 99%  Weight: 169 lb 8 oz (76.9 kg)  Height: 5' 6.5 (1.689 m)    Body mass index is 26.95 kg/m.   General: The patient is well-developed and well-nourished and in no acute distress  HEENT:  Head is [place]/AT.  Sclera are anicteric.    Neck: No carotid bruits are noted.  The neck is nontender.  Cardiovascular: The heart has a regular rate and rhythm with a normal S1 and S2. There were no murmurs, gallops or rubs.    Skin: Extremities are without rash.  She has ankle edema, left greater than right  Musculoskeletal:  Back is nontender  Neurologic Exam  Mental status: The patient is alert and oriented x 3 at the time of the examination.  She scored 25/30 on the [facility] cognitive assessment, losing 3 points for short-term recall.  I retested her short-term recall about 15 minutes later and she did better getting 3 of the 5 words, 4 with a category prompt.  Speech is normal.  Cranial nerves: Extraocular movements are full. Pupils are equal, round, and reactive to light and accomodation.  Visual fields are full.  Facial symmetry is present. There is good facial sensation to soft touch bilaterally.Facial strength is normal.  Trapezius and sternocleidomastoid strength is normal. No dysarthria is noted.  The tongue is midline, and the patient has symmetric elevation of the soft palate. No obvious hearing deficits are noted.  Motor:  Muscle bulk is normal.   Tone is normal. Strength is  5 / 5 in all 4 extremities.   Sensory: Sensory testing is intact to pinprick, soft touch and vibration sensation in all 4 extremities.  Coordination: Cerebellar testing reveals good finger-nose-finger and heel-to-shin bilaterally.  Gait and station: Station is normal.   Gait is normal. Tandem gait is mildly wide. Romberg is negative.   Reflexes: Deep tendon reflexes are symmetric and normal bilaterally.   Plantar responses  are flexor.    DIAGNOSTIC DATA (LABS, IMAGING, TESTING) - I reviewed patient records, labs, notes, testing and imaging myself where available.  Lab Results  Component Value Date   HGB 11.7 (L) 09/09/2012      Component Value Date/Time   NA 138 09/08/2012 1500   K 4.4 09/08/2012 1500   CL 99 09/08/2012 1500   CO2 32 09/08/2012 1500   GLUCOSE 197 (H) 09/08/2012 1500   BUN 19 09/08/2012 1500   CREATININE 0.76 09/08/2012 1500   CALCIUM 9.6 09/08/2012 1500   GFRNONAA 84 (L) 09/08/2012 1500   GFRAA >90 09/08/2012 1500       ASSESSMENT AND PLAN  Memory loss - Plan: TSH, ATN PROFILE, TSH, CANCELED: Vitamin B12, CANCELED: Thyroid  Panel With TSH  Mild cognitive impairment - Plan: TSH, ATN PROFILE, TSH, CANCELED: Vitamin B12, CANCELED: Thyroid  Panel With TSH   In summary, Ms. Wimes is an 78 year old woman who has been noted to have difficulties with memory.  She feels that her cognitive function is unchanged.  She scored 25/30 on the [facility] cognitive assessment which would be consistent with mild cognitive impairment.  However, when I retested her she did 1 point better on the short-term recall so would be in the lower limit of normal.  I will go ahead and check Alzheimer biomarkers including the amyloid beta 42/40 ratio and pTau181.  Additionally we will check a TSH.  B12 is low and she is going to orally supplement this.  I did not schedule follow-up but will do so based on the results of the blood work.  If cognition appears to significantly worsen, then we can also check a noncontrasted MRI of the brain.  Thank you for asking me to see this patient.  Please let me know if I can be of further assistance with her or other patients in the future    Evelean Bigler A. Vear, MD, [facility] 08/20/2023, 3:47 PM Certified in Neurology, Clinical Neurophysiology, Sleep Medicine and Neuroimaging  [facility] [address], [address] [place], [place] [address] [phone]

## 2023-08-23 LAB — ATN PROFILE
A -- Beta-amyloid 42/40 Ratio: 0.122 (ref >0.102)
Beta-amyloid 40: 173.36 pg/mL
Beta-amyloid 42: 21.15 pg/mL
N -- NfL, Plasma: 2.47 pg/mL (ref 0.00–9.13)
T -- p-tau181: 0.68 pg/mL (ref 0.00–0.97)

## 2023-08-23 LAB — TSH: TSH: 2.01 u[IU]/mL (ref 0.450–4.500)

## 2023-08-26 ENCOUNTER — Ambulatory Visit: Payer: Self-pay | Admitting: Neurology

## 2024-02-03 DIAGNOSIS — E11319 Type 2 diabetes mellitus with unspecified diabetic retinopathy without macular edema: Secondary | ICD-10-CM | POA: Diagnosis not present

## 2024-02-03 DIAGNOSIS — E538 Deficiency of other specified B group vitamins: Secondary | ICD-10-CM | POA: Diagnosis not present

## 2024-02-03 DIAGNOSIS — M109 Gout, unspecified: Secondary | ICD-10-CM | POA: Diagnosis not present

## 2024-02-03 DIAGNOSIS — E7800 Pure hypercholesterolemia, unspecified: Secondary | ICD-10-CM | POA: Diagnosis not present

## 2024-02-03 DIAGNOSIS — I10 Essential (primary) hypertension: Secondary | ICD-10-CM | POA: Diagnosis not present
